# Patient Record
Sex: Male | Born: 1985 | Race: White | Hispanic: No | Smoking: Current every day smoker
Health system: Southern US, Community
[De-identification: ages and names within clinical notes are randomized; demographics above are authoritative.]

## PROBLEM LIST (undated history)

## (undated) DIAGNOSIS — F909 Attention-deficit hyperactivity disorder, unspecified type: Secondary | ICD-10-CM

## (undated) HISTORY — PX: NO PAST SURGERIES: SHX2092

---

## 2007-09-15 ENCOUNTER — Emergency Department: Payer: Self-pay | Admitting: Emergency Medicine

## 2007-12-27 ENCOUNTER — Emergency Department: Payer: Self-pay | Admitting: Emergency Medicine

## 2008-02-24 ENCOUNTER — Emergency Department: Payer: Self-pay | Admitting: Emergency Medicine

## 2008-07-24 ENCOUNTER — Emergency Department: Payer: Self-pay | Admitting: Emergency Medicine

## 2008-07-29 ENCOUNTER — Emergency Department: Payer: Self-pay | Admitting: Emergency Medicine

## 2013-11-29 ENCOUNTER — Emergency Department: Payer: Self-pay | Admitting: Emergency Medicine

## 2014-02-20 ENCOUNTER — Encounter (HOSPITAL_COMMUNITY): Payer: Self-pay | Admitting: *Deleted

## 2014-02-20 ENCOUNTER — Emergency Department (HOSPITAL_COMMUNITY)
Admission: EM | Admit: 2014-02-20 | Discharge: 2014-02-20 | Disposition: A | Discharge status: Home / Self Care | Payer: BLUE CROSS/BLUE SHIELD | Attending: Emergency Medicine | Admitting: Emergency Medicine

## 2014-02-20 DIAGNOSIS — Z720 Tobacco use: Secondary | ICD-10-CM | POA: Diagnosis not present

## 2014-02-20 DIAGNOSIS — Y998 Other external cause status: Secondary | ICD-10-CM | POA: Insufficient documentation

## 2014-02-20 DIAGNOSIS — Z880 Allergy status to penicillin: Secondary | ICD-10-CM | POA: Insufficient documentation

## 2014-02-20 DIAGNOSIS — Y9289 Other specified places as the place of occurrence of the external cause: Secondary | ICD-10-CM | POA: Diagnosis not present

## 2014-02-20 DIAGNOSIS — S39012A Strain of muscle, fascia and tendon of lower back, initial encounter: Secondary | ICD-10-CM | POA: Diagnosis not present

## 2014-02-20 DIAGNOSIS — Z7982 Long term (current) use of aspirin: Secondary | ICD-10-CM | POA: Insufficient documentation

## 2014-02-20 DIAGNOSIS — Z79899 Other long term (current) drug therapy: Secondary | ICD-10-CM | POA: Insufficient documentation

## 2014-02-20 DIAGNOSIS — X58XXXA Exposure to other specified factors, initial encounter: Secondary | ICD-10-CM | POA: Insufficient documentation

## 2014-02-20 DIAGNOSIS — Y9389 Activity, other specified: Secondary | ICD-10-CM | POA: Insufficient documentation

## 2014-02-20 DIAGNOSIS — T148XXA Other injury of unspecified body region, initial encounter: Secondary | ICD-10-CM

## 2014-02-20 DIAGNOSIS — R109 Unspecified abdominal pain: Secondary | ICD-10-CM | POA: Diagnosis present

## 2014-02-20 MED ORDER — DIAZEPAM 5 MG/ML IJ SOLN
5.0000 mg | Freq: Once | INTRAMUSCULAR | Status: AC
  Administered 2014-02-20: 5 mg via INTRAMUSCULAR
  Filled 2014-02-20: qty 2

## 2014-02-20 MED ORDER — CYCLOBENZAPRINE HCL 10 MG PO TABS: 10.0000 mg | ORAL_TABLET | Freq: Two times a day (BID) | ORAL | Status: DC | PRN

## 2014-02-20 NOTE — ED Provider Notes (Signed)
CSN: [id number]     Arrival date & time 02/20/14  1157 History   First MD Initiated Contact with Patient 02/20/14 1513     Chief Complaint  Patient presents with  . Abdominal Pain   (Consider location/radiation/quality/duration/timing/severity/associated sxs/prior Treatment) HPI Jacob Maynard is a 29 yo male presenting with persistent back and right sided rib pain x 2 weeks. He first noticed the pain 2 weeks ago as a gradual onset dull pain.  He states the pain seems to be progressively worsening and notes the pain became sharp approximately 1 week ago.  He was seen in the ED at [facility] and had labs and abd CT and diagnosed with constipation.  He was treated and this problem has resolved but his pain has continued.  He went back to the [facility] ED and told he had a pulled muscle but was discharged without further treatment due to an emergency in the ED.  He states while he is sitting still he is not bothered by the pain.  It is worsened by moving, bending and twisting.  The pain is also made worse with palpation.  He denies fevers, chills, nausea, vomiting, abd pain or urinary symptoms.    History reviewed. No pertinent past medical history. History reviewed. No pertinent past surgical history. History reviewed. No pertinent family history. History  Substance Use Topics  . Smoking status: Current Every Day Smoker    Types: Cigarettes  . Smokeless tobacco: Not on file  . Alcohol Use: No    Review of Systems  Constitutional: Negative for fever and chills.  HENT: Negative for sore throat.   Eyes: Negative for visual disturbance.  Respiratory: Negative for cough and shortness of breath.   Cardiovascular: Negative for chest pain and leg swelling.  Gastrointestinal: Negative for nausea, vomiting and diarrhea.  Genitourinary: Negative for dysuria.  Musculoskeletal: Positive for myalgias.  Skin: Negative for rash.  Neurological: Negative for weakness, numbness and headaches.    Allergies   Amoxicillin; Penicillins; and Tramadol  Home Medications   Prior to Admission medications   Medication Sig Start Date End Date Taking? Authorizing Provider  Aspirin-Acetaminophen-Caffeine (GOODY HEADACHE PO) Take 1 packet by mouth every 4 (four) hours as needed (headache).   Yes Historical Provider, MD  ibuprofen (ADVIL,MOTRIN) 200 MG tablet Take 400 mg by mouth every 4 (four) hours as needed for mild pain.   Yes Historical Provider, MD  NABUMETONE PO Take 750 mg by mouth 2 (two) times daily.   Yes Historical Provider, MD   BP 120/85 mmHg  Pulse 97  Temp(Src) 99.1 F (37.3 C) (Oral)  Resp 14  Ht  (1.727 m)  Wt 195 lb (88.451 kg)  BMI 29.66 kg/m2  SpO2 99% Physical Exam  Constitutional: He appears well-developed and well-nourished. No distress.  HENT:  Head: Normocephalic and atraumatic.  Mouth/Throat: Oropharynx is clear and moist. No oropharyngeal exudate.  Eyes: Conjunctivae are normal.  Neck: Neck supple. No thyromegaly present.  Cardiovascular: Normal rate, regular rhythm and intact distal pulses.   Pulmonary/Chest: Effort normal and breath sounds normal. No respiratory distress. He has no wheezes. He has no rales.  Abdominal: Soft. There is no tenderness.  Musculoskeletal: He exhibits tenderness.       Thoracic back: He exhibits tenderness.       Back:  Lymphadenopathy:    He has no cervical adenopathy.  Neurological: He is alert.  Skin: Skin is warm and dry. No rash noted. He is not diaphoretic.  Psychiatric: He  has a normal mood and affect.  Nursing note and vitals reviewed.   ED Course  Procedures (including critical care time) Labs Review Labs Reviewed  URINALYSIS, ROUTINE W REFLEX MICROSCOPIC   Imaging Review No results found.   EKG Interpretation None      MDM   Final diagnoses:  Musculoskeletal strain   29 yo with reproducible pain over thoracic paraspinous muscles and right posterior ribs.  Pain exacerbated by twisting and bending. He  was evaluated twice for pain with work-up including labs and abd/pelvis CT showing only constipation.  His pain on exam here seems to be musculoskeletal in nature. Treated with valium in the ED and discharged with prescription for muscle relaxants. Pt is well-appearing, in no acute distress and vital signs reviewed and non-concerning. He appears safe to be discharged.  Discharge include follow-up with their PCP.  Return precautions provided.  Pt aware of plan and in agreement.     Filed Vitals:   02/20/14 1210 02/20/14 1523 02/20/14 1530 02/20/14 1600  BP: 134/86 120/85 110/85 113/87  Pulse: 105 97 83 81  Temp: 97.3 F (36.3 C) 99.1 F (37.3 C)    TempSrc: Oral Oral    Resp: 18 14 27 17   Height: 5\' 8"  (1.727 m)     Weight: 195 lb (88.451 kg)     SpO2: 98% 99% 98% 98%   Meds given in ED:  Medications  diazepam (VALIUM) injection 5 mg (5 mg Intramuscular Given 02/20/14 1541)    Discharge Medication List as of 02/20/2014  4:09 PM    START taking these medications   Details  cyclobenzaprine (FLEXERIL) 10 MG tablet Take 1 tablet (10 mg total) by mouth 2 (two) times daily as needed for muscle spasms., Starting 02/20/2014, Until Discontinued, Print           Harle BattiestElizabeth Willeen Novak, NP 02/22/14 [id number]  Hilario Quarryanielle S Ray, MD 02/23/14 [phone]

## 2014-02-20 NOTE — Discharge Instructions (Signed)
These follow the directions provided. Use the resources guide with the referral provided to establish care with a primary care doctor. You may also make a follow-up appointment with Dr. Magnus IvanBlackman to evaluate this pain specifically. Continue to take her anti-inflammatory medicine as prescribed, but also begin taking the Flexeril twice a day to help with muscle spasms. Rest the area initially, and slowly begin to stretch and resume normal activities. You may use ice or heat for comfort, 20 minutes on 20 minutes off. Don't hesitate to return for any new, worsening, or concerning symptoms.  SEEK MEDICAL CARE IF:  You have increasing pain or swelling in the injured area.  You have numbness, tingling, or a significant loss of strength in the injured area.    Emergency Department Resource Guide 1) Find a Doctor and Pay Out of Pocket Although you won't have to find out who is covered by your insurance plan, it is a good idea to ask around and get recommendations. You will then need to call the office and see if the doctor you have chosen will accept you as a new patient and what types of options they offer for patients who are self-pay. Some doctors offer discounts or will set up payment plans for their patients who do not have insurance, but you will need to ask so you aren't surprised when you get to your appointment.  2) Contact Your Local Health Department Not all health departments have doctors that can see patients for sick visits, but many do, so it is worth a call to see if yours does. If you don't know where your local health department is, you can check in your phone book. The CDC also has a tool to help you locate your state's health department, and many state websites also have listings of all of their local health departments.  3) Find a Walk-in Clinic If your illness is not likely to be very severe or complicated, you may want to try a walk in clinic. These are popping up all over the country in  pharmacies, drugstores, and shopping centers. They're usually staffed by nurse practitioners or physician assistants that have been trained to treat common illnesses and complaints. They're usually fairly quick and inexpensive. However, if you have serious medical issues or chronic medical problems, these are probably not your best option.  No Primary Care Doctor: - Call Health Connect at  [phone] - they can help you locate a primary care doctor that  accepts your insurance, provides certain services, etc. - Physician Referral Service- [phone]  Chronic Pain Problems: Organization         Address  Phone   Notes  [facility]  [phone] Patients need to be referred by their primary care doctor.   Medication Assistance: Organization         Address  Phone   Notes  [facility] [address] [place], [place] [id number] [phone]) [phone] --Must be a resident of [facility] [place] -- Must have NO insurance coverage whatsoever (no Medicaid/ Medicare, etc.) -- The pt. MUST have a primary care doctor that directs their care regularly and follows them in the community   [facility]  [phone]   [employer]  [phone]) [phone]    Agencies that provide inexpensive medical care: Organization         Address  Phone   Notes  [facility]  [phone]) [phone]   [facility]    (  [phone]) [phone]   °[facility] [address] °[place], [place] [address] (336) [phone]   °[facility] [address], °[place] (336) [phone]   °[facility]    (336) 373-0678   °[facility]    (336) 272-1050   °[facility] ° [address], [place] Phone:  (336) [phone], Fax:  ([phone]) [phone] Hours of Operation:  9 am - 6 pm, M-F.  Also accepts Medicaid/Medicare and self-pay.  °[facility] ° [address],  [place] Phone: ([phone]) [phone], Fax: [phone]. Hours of Operation:  8:30 am - 5:30 pm, M-F.  Also accepts Medicaid and self-pay.  °[facility] [place] [address], [place] Phone: (336) [phone]   °[facility] [address], [place], [place] (336)[phone], Ext. 123 Mondays & Thursdays: 7-9 AM.  First 15 patients are seen on a first come, first serve basis. °  ° °Medicaid-accepting [place] Providers: ° °Organization         Address  Phone   Notes  °[facility] [address], [place] ([phone]) [phone] Also accepts self-pay patients.  °[facility] [address], [place] ° (336) [phone]   °[facility] [address], [place] (336) 288-8857   °[facility] [address] [place] [address], [place] (336) 299-7000   °Veita Bland [address], [place]  ° (336) [phone] Only accepts [place] Access Medicaid patients after they have their name applied to their card.  ° °Self-Pay (no insurance) in [place]: ° °Organization         Address  Phone   Notes  °Sickle Cell Patients, [facility] [address], [place] (336) 832-1970   °[facility] [address], [place] (336) 832-4400   °[facility] ° [address],  (336) [phone]   °[facility]/Dr. Osei-Bonsu ° [address] [place] [address], [place] or [address], [place] (336) [phone] Phone number for both [place] and [place] locations is the same.  °[facility] [address], [place] (336) [phone]   °[facility] [address] [place] [address],  or [address] (336) 852-7530 °(336) 878-2260   °[facility] [address],  (336) 350-1642, phone; ([phone]) [phone], fax Sees patients 1st and 3rd Saturday of every month.  Must not  qualify for public or private insurance (i.e. Medicaid, Medicare, [place] Health Choice, Veterans' Benefits) • Household income should be no more than 200% of the poverty level •The clinic cannot treat you if you are pregnant or think you are pregnant • Sexually transmitted diseases are not treated at the clinic.  ° ° °Dental Care: °Organization         Address  Phone  Notes  °[facility] [address],  (336) 641-6152 Accepts children up to age 21 who are enrolled in Medicaid or [place] Health Choice; pregnant women with a Medicaid card; and children who have applied for Medicaid or [place] Health Choice, but were declined, whose parents can pay a reduced fee at time of service.  °[facility] [place]  [address], [place] (336) 641-7733 Accepts children up   to age 21 who are enrolled in Medicaid or [place] Health Choice; pregnant women with a Medicaid card; and children who have applied for Medicaid or [place] Health Choice, but were declined, whose parents can pay a reduced fee at time of service.  °[facility] ° [address], [place] (336) 641-4533 Patients are seen by appointment only. Walk-ins are not accepted. [facility] will see patients 18 years of age and older. °Monday - Tuesday (8am-5pm) °Most Wednesdays (8:30-5pm) °$30 per visit, cash only  °[facility] ° [address], [place] (336) [phone] Patients are seen by appointment only. Walk-ins are not accepted. [facility] will see patients 18 years of age and older. °One Wednesday Evening (Monthly: Volunteer Based).  $30 per visit, cash only  °[facility]  ([phone]) [phone] for adults; Children under age 4, call Graduate Pediatric Dentistry at ([phone]. Children aged 4-14, please call [phone]) [phone] to request a pediatric application. ° Dental services are provided  in all areas of dental care including fillings, crowns and bridges, complete and partial dentures, implants, gum treatment, root canals, and extractions. Preventive care is also provided. Treatment is provided to both adults and children. °Patients are selected via a lottery and there is often a waiting list. °  °[facility] [address], °[place] ° (336) 763-8833 [email or web address] °  °[facility] [address], [place], [place] (336)723-1848, Ext. 123 Second and Fourth Thursday of each month, opens at 6:30 AM; Clinic ends at 9 AM.  Patients are seen on a first-come first-served basis, and a limited number are seen during each clinic.  ° °[facility] ° [address], [place], [place] (336) [phone]   Eligibility Requirements °You must have lived in [place], [place], or [place] counties for at least the last three months. °  You cannot be eligible for state or federal sponsored healthcare insurance, including Veterans Administration, Medicaid, or Medicare. °  You generally cannot be eligible for healthcare insurance through your employer.  °  How to apply: °Eligibility screenings are held every Tuesday and Wednesday afternoon from 1:00 pm until 4:00 pm. You do not need an appointment for the interview!  °[facility] [address], [place], [place] 336-631-2330   °[facility]  336-342-8273   °[facility]  336-703-3100   °[facility]  336-570-6415   ° °Behavioral Health Resources in the Community: °Intensive Outpatient Programs °Organization         Address  Phone  Notes  °[facility] [address], [place], [place] 336-878-6098   °[facility] [address], [place], [place] 336-832-9800   °[facility] [address], [place], [place] ° 336-882-2125   °[facility] [address],  °[place],   1-800-853-5163 or 336-641-4981   °Substance Abuse Resources °Organization         Address  Phone  Notes  °[facility]  336-882-2125   °[facility]  336-784-9470   °[facility]  336-285-9073   °[facility]  336-845-3988   °Residential & Outpatient Substance Abuse Program  1-800-659-3381   °Psychological Services °Organization         Address  Phone  Notes  °[facility]  336- 832-9600   °[facility]  336- 378-7881   °[facility]   [address], [place] 1-800-853-5163 or 336-641-4981   ° °Mobile Crisis Teams °Organization         Address  Phone  Notes  °[facility], Mobile Crisis Care Unit  1-877-626-1772   °Assertive °[facility] ° [address] [place], [place] 336-834-9664   °Sharon DeEsch [address] °[place] 336-554-5454   ° °Self-Help/Support Groups °Organization         Address  Phone             Notes  °[facility] - variety of support groups  336- 373-1402 Call for more information  °Narcotics Anonymous (NA), [facility] [address], °[place]  2 meetings at this location  ° °Residential Treatment Programs °Organization         Address  Phone  Notes  °[facility] [address],    °[place]  1-866-801-8205   °[facility] ° [address], [place], [place] 704-293-8524   °[facility] [address], [place] 336-845-3988 Admissions: 8am-3pm M-F  °[facility] [address]    °[place], [place] 336-841-1104   °[facility] [address], [place], [place] 336-379-7146   °[facility] [address]  °[place], [place] 336-285-9073   °[facility] Intensive Outpatient [address], [place], [place] 336-852-3033   °[facility] [address]  °[place], [place] 1-877-615-2722 or  336-784-9470   °[facility] ([facility] [address] [place], [place] 336-227-7417 Accepts Medicaid  °[facility] [address]  ° [place] 1-800-659-3381 Substance Abuse/Addiction Treatment  ° °[place] Behavioral Health Resources °Organization         Address  Phone  Notes  °[facility]  (888) 581-9988   °Julie Brannon, PhD [address] [place], [place]   (336) 349-5553 or (336) 951-0000   °[facility]   [address] °[place], [place] (336) 349-4454   °[facility] [address], [place], [place] (336) 342-8316 Insurance/Medicaid/sponsorship through [facility]  °[facility] [address]                                    [place], [place] (336) 342-8316 Therapy/tele-psych/case  °[facility] [address]  ° [place], [place] (336) 349-2233    °Dr. Arfeen  (336) 349-4544   °[facility] [place]  [employer] [place] [facility] 1) [address], [place] °2) [address], [place] °3)  [address], [place] (336) 349-3220 °(336) 342-7768 ° °(336) 342-8140   °[place] [facility] (336) 342-1394 or (336) 342-3537 (After Hours)    ° ° ° °

## 2014-02-20 NOTE — ED Notes (Signed)
Pt [facility] for right side pain x 12 days. 1st visit had negative ct scan for kidney stone. Denies urinary symptoms. They told pt was constipated and gave him laxative. Pt still had pain so he went back and they said muscle strain/rib pain. Pt still having pain and wants 3rd opinion and work note.

## 2014-03-01 ENCOUNTER — Other Ambulatory Visit (HOSPITAL_COMMUNITY): Payer: Self-pay | Admitting: Orthopaedic Surgery

## 2014-03-01 DIAGNOSIS — M25512 Pain in left shoulder: Secondary | ICD-10-CM

## 2014-03-17 ENCOUNTER — Ambulatory Visit (HOSPITAL_COMMUNITY): Admission: RE | Admit: 2014-03-17 | Payer: BLUE CROSS/BLUE SHIELD | Source: Ambulatory Visit

## 2016-05-08 ENCOUNTER — Encounter (HOSPITAL_COMMUNITY): Payer: Self-pay | Admitting: Emergency Medicine

## 2016-05-08 ENCOUNTER — Emergency Department (HOSPITAL_COMMUNITY)
Admission: EM | Admit: 2016-05-08 | Discharge: 2016-05-08 | Disposition: A | Discharge status: Home / Self Care | Payer: BLUE CROSS/BLUE SHIELD | Attending: Emergency Medicine | Admitting: Emergency Medicine

## 2016-05-08 ENCOUNTER — Emergency Department (HOSPITAL_COMMUNITY): Payer: BLUE CROSS/BLUE SHIELD

## 2016-05-08 DIAGNOSIS — Y92410 Unspecified street and highway as the place of occurrence of the external cause: Secondary | ICD-10-CM | POA: Diagnosis not present

## 2016-05-08 DIAGNOSIS — Y999 Unspecified external cause status: Secondary | ICD-10-CM | POA: Insufficient documentation

## 2016-05-08 DIAGNOSIS — Z7982 Long term (current) use of aspirin: Secondary | ICD-10-CM | POA: Insufficient documentation

## 2016-05-08 DIAGNOSIS — S060X0A Concussion without loss of consciousness, initial encounter: Secondary | ICD-10-CM | POA: Diagnosis not present

## 2016-05-08 DIAGNOSIS — Y939 Activity, unspecified: Secondary | ICD-10-CM | POA: Insufficient documentation

## 2016-05-08 DIAGNOSIS — F17210 Nicotine dependence, cigarettes, uncomplicated: Secondary | ICD-10-CM | POA: Diagnosis not present

## 2016-05-08 DIAGNOSIS — S0181XA Laceration without foreign body of other part of head, initial encounter: Secondary | ICD-10-CM | POA: Insufficient documentation

## 2016-05-08 DIAGNOSIS — S0990XA Unspecified injury of head, initial encounter: Secondary | ICD-10-CM | POA: Diagnosis present

## 2016-05-08 MED ORDER — LIDOCAINE HCL 2 % IJ SOLN
10.0000 mL | Freq: Once | INTRAMUSCULAR | Status: AC
  Administered 2016-05-08: 200 mg
  Filled 2016-05-08: qty 20

## 2016-05-08 MED ORDER — CYCLOBENZAPRINE HCL 10 MG PO TABS: 10.0000 mg | ORAL_TABLET | Freq: Two times a day (BID) | ORAL | 0 refills | Status: DC | PRN

## 2016-05-08 MED ORDER — IBUPROFEN 400 MG PO TABS
600.0000 mg | ORAL_TABLET | Freq: Once | ORAL | Status: AC
  Administered 2016-05-08: 600 mg via ORAL
  Filled 2016-05-08: qty 1

## 2016-05-08 MED ORDER — TETRACAINE HCL 0.5 % OP SOLN
1.0000 [drp] | Freq: Once | OPHTHALMIC | Status: AC
  Administered 2016-05-08: 1 [drp] via OPHTHALMIC
  Filled 2016-05-08: qty 2

## 2016-05-08 MED ORDER — TETANUS-DIPHTH-ACELL PERTUSSIS 5-2.5-18.5 LF-MCG/0.5 IM SUSP
0.5000 mL | Freq: Once | INTRAMUSCULAR | Status: AC
  Administered 2016-05-08: 0.5 mL via INTRAMUSCULAR
  Filled 2016-05-08: qty 0.5

## 2016-05-08 MED ORDER — FLUORESCEIN SODIUM 0.6 MG OP STRP
1.0000 | ORAL_STRIP | Freq: Once | OPHTHALMIC | Status: AC
  Administered 2016-05-08: 1 via OPHTHALMIC
  Filled 2016-05-08: qty 1

## 2016-05-08 MED ORDER — HYDROCODONE-ACETAMINOPHEN 5-325 MG PO TABS
1.0000 | ORAL_TABLET | Freq: Once | ORAL | Status: AC
  Administered 2016-05-08: 1 via ORAL
  Filled 2016-05-08: qty 1
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
## 2016-05-08 NOTE — ED Notes (Signed)
Dr. Anitra Lauth at bedside suturing pt.'s laceration .

## 2016-05-08 NOTE — ED Notes (Signed)
Dr. Anitra Lauth notified that pt. refused CT scan of head.

## 2016-05-08 NOTE — ED Notes (Signed)
Morgan lens applied at left eye / irrigation with NS solution in progress.

## 2016-05-08 NOTE — ED Notes (Signed)
Patient transported to CT SCAN . 

## 2016-05-28 ENCOUNTER — Other Ambulatory Visit: Payer: Self-pay | Admitting: Family Medicine

## 2016-06-13 ENCOUNTER — Other Ambulatory Visit: Payer: BLUE CROSS/BLUE SHIELD

## 2016-07-08 ENCOUNTER — Ambulatory Visit
Admission: RE | Admit: 2016-07-08 | Discharge: 2016-07-08 | Disposition: A | Discharge status: Home / Self Care | Payer: BLUE CROSS/BLUE SHIELD | Source: Ambulatory Visit | Attending: Family Medicine | Admitting: Family Medicine

## 2016-07-08 MED ORDER — IOPAMIDOL (ISOVUE-300) INJECTION 61%
75.0000 mL | Freq: Once | INTRAVENOUS | Status: AC | PRN
Start: 2016-07-08 — End: 2016-07-08
  Administered 2016-07-08: 75 mL via INTRAVENOUS

## 2017-10-17 ENCOUNTER — Other Ambulatory Visit: Payer: Self-pay

## 2017-10-17 ENCOUNTER — Encounter: Payer: Self-pay | Admitting: Emergency Medicine

## 2017-10-17 ENCOUNTER — Emergency Department: Payer: 59

## 2017-10-17 ENCOUNTER — Emergency Department
Admission: EM | Admit: 2017-10-17 | Discharge: 2017-10-17 | Disposition: A | Discharge status: Home / Self Care | Payer: 59 | Attending: Emergency Medicine | Admitting: Emergency Medicine

## 2017-10-17 DIAGNOSIS — F17210 Nicotine dependence, cigarettes, uncomplicated: Secondary | ICD-10-CM | POA: Insufficient documentation

## 2017-10-17 DIAGNOSIS — Y999 Unspecified external cause status: Secondary | ICD-10-CM | POA: Insufficient documentation

## 2017-10-17 DIAGNOSIS — W270XXA Contact with workbench tool, initial encounter: Secondary | ICD-10-CM | POA: Diagnosis not present

## 2017-10-17 DIAGNOSIS — Y929 Unspecified place or not applicable: Secondary | ICD-10-CM | POA: Diagnosis not present

## 2017-10-17 DIAGNOSIS — S6991XA Unspecified injury of right wrist, hand and finger(s), initial encounter: Secondary | ICD-10-CM | POA: Diagnosis present

## 2017-10-17 DIAGNOSIS — Y939 Activity, unspecified: Secondary | ICD-10-CM | POA: Insufficient documentation

## 2017-10-17 DIAGNOSIS — S62630B Displaced fracture of distal phalanx of right index finger, initial encounter for open fracture: Secondary | ICD-10-CM | POA: Insufficient documentation

## 2017-10-17 MED ORDER — LIDOCAINE HCL (PF) 1 % IJ SOLN
INTRAMUSCULAR | Status: AC
  Administered 2017-10-17: 5 mL via INTRADERMAL
  Filled 2017-10-17: qty 5

## 2017-10-17 MED ORDER — OXYCODONE-ACETAMINOPHEN 5-325 MG PO TABS: 1.0000 | ORAL_TABLET | Freq: Four times a day (QID) | ORAL | 0 refills | Status: AC | PRN

## 2017-10-17 MED ORDER — LIDOCAINE HCL (PF) 1 % IJ SOLN
5.0000 mL | Freq: Once | INTRAMUSCULAR | Status: AC
  Administered 2017-10-17: 5 mL via INTRADERMAL
  Filled 2017-10-17: qty 5

## 2017-10-17 MED ORDER — CLINDAMYCIN HCL 300 MG PO CAPS: 300.0000 mg | ORAL_CAPSULE | Freq: Four times a day (QID) | ORAL | 0 refills | Status: AC

## 2017-10-17 MED ORDER — BACITRACIN-NEOMYCIN-POLYMYXIN 400-5-5000 EX OINT
TOPICAL_OINTMENT | Freq: Once | CUTANEOUS | Status: AC
  Administered 2017-10-17: 21:00:00 via TOPICAL
  Filled 2017-10-17: qty 1

## 2017-10-17 MED ORDER — OXYCODONE-ACETAMINOPHEN 5-325 MG PO TABS
1.0000 | ORAL_TABLET | Freq: Once | ORAL | Status: AC
  Administered 2017-10-17: 1 via ORAL
  Filled 2017-10-17: qty 1

## 2017-10-17 MED ORDER — CLINDAMYCIN PHOSPHATE 600 MG/50ML IV SOLN
600.0000 mg | Freq: Once | INTRAVENOUS | Status: AC
  Administered 2017-10-17: 600 mg via INTRAVENOUS
  Filled 2017-10-17: qty 50
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
## 2017-10-17 NOTE — ED Triage Notes (Signed)
First nurse notified of pt
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
## 2021-03-12 NOTE — ED Provider Notes (Signed)
 ------------------------------------------------------------------------------- Attestation signed by Junita Zachary Sages, DO at 03/12/21 1919 I was the supervising physician in the delivery of the service.  I have reviewed the H&P and agree with the assessment and plan in the note.  -------------------------------------------------------------------------------  [facility] Emergency Department Provider Note    ED Clinical Impression   Final diagnoses:  Viral illness (Primary)    ED Course   Jacob Maynard is a 36 y.o. male with a past medical history of COVID-19 two months ago presenting with flulike symptoms for the last 2 days.   On exam, patient is well-appearing and in NAD.  Vitals are WNL.  Cardiopulmonary exam is unremarkable.  Abdomen soft nontender.  4 Plex done in triage negative.  Suspect viral etiology.  Unclear if this is COVID-19 but he has taken off work until next Tuesday which is probably the best decision.  Supportive care instructions given.  Work note given. Return precautions given. Patient in agreement with plan.    History   Chief Complaint  Letter for School/Work   HPI  Jacob Maynard is a 36 y.o. male with a past medical history of COVID-19 two months ago presenting with flulike symptoms for the last 2 days.  He notes the plan that he works in as an outbreak of COVID.  He started to get sick on Sunday with diarrhea, vomiting, nausea, headache, fever although none today, cough and runny nose.  He has been taking antipyretics for his symptoms.  He had a positive home test for COVID on Monday.  He denies any abdominal pain, chest pain or shortness of breath.  He has been able to tolerate p.o. today without difficulty.  He says this is very different than his symptoms with COVID previously.  He had much more shortness of breath.  He has also had a false positive previously.  His work told him he needed to get a PCR test so he presents to the  emergency department.   Past Medical History:  Diagnosis Date  . Dental decay     No past surgical history on file.  No current facility-administered medications for this encounter.  Current Outpatient Medications:  .  albuterol HFA 90 mcg/actuation inhaler, Inhale 2 puffs every six (6) hours as needed for wheezing., Disp: 8 g, Rfl: 0  Allergies Ibuprofen , Amoxicillin, Penicillins, and Tramadol  History reviewed. No pertinent family history.  Social History Social History   Tobacco Use  . Smoking status: Every Day    Packs/day: 1.00    Types: Cigarettes  . Smokeless tobacco: Never  Vaping Use  . Vaping Use: Never used  Substance Use Topics  . Alcohol use: No  . Drug use: Yes    Types: Marijuana    Comment: Last Use MJ- 01/06/21    Review of Systems Review of Systems  Constitutional: Positive for fever.  HENT: Positive for rhinorrhea. Negative for sore throat.   Respiratory: Positive for cough. Negative for shortness of breath.   Cardiovascular: Negative for chest pain.  Gastrointestinal: Positive for diarrhea, nausea and vomiting. Negative for abdominal pain.  Genitourinary: Negative for dysuria.  Musculoskeletal: Negative for myalgias.  Skin: Negative for rash.  Neurological: Positive for headaches.  All other systems reviewed and are negative.   Physical Exam   Vital Signs BP 122/83   Pulse 87   Temp 36.7 C (98 F) (Temporal)   Resp 18   Ht 172.7 cm (5' 8)   Wt (!) 101.6 kg (224 lb)  SpO2 98%   BMI 34.06 kg/m   Physical Exam Physical Exam Vitals and nursing note reviewed.  Constitutional:      General: He is not in acute distress.    Appearance: He is well-developed.  HENT:     Head: Normocephalic and atraumatic.  Eyes:     General: No scleral icterus.    Conjunctiva/sclera: Conjunctivae normal.  Cardiovascular:     Rate and Rhythm: Normal rate and regular rhythm.     Heart sounds: Normal heart sounds. No murmur heard.   No friction  rub. No gallop.  Pulmonary:     Effort: Pulmonary effort is normal. No respiratory distress.     Breath sounds: Normal breath sounds. No wheezing or rales.  Abdominal:     General: Bowel sounds are normal. There is no distension.     Palpations: Abdomen is soft. There is no mass.     Tenderness: There is no abdominal tenderness. There is no guarding or rebound.  Musculoskeletal:        General: Normal range of motion.     Cervical back: Normal range of motion.  Skin:    General: Skin is warm and dry.     Findings: No rash.  Neurological:     Mental Status: He is alert and oriented to person, place, and time.     Coordination: Coordination normal.  Psychiatric:        Behavior: Behavior normal.        Thought Content: Thought content normal.        Judgment: Judgment normal.     Pertinent Labs and Radiology   Results for orders placed or performed during the hospital encounter of 03/12/21  RAPID INFLUENZA/RSV/COVID PCR   Specimen: Nasopharyngeal Swab  Result Value Ref Range   SARS-CoV-2 PCR Negative Negative   Influenza A Negative Negative   Influenza B Negative Negative   RSV Negative Negative    No results found.  Medical Decision Making Viral illness: acute illness or injury Amount and/or Complexity of Data Reviewed Labs:  Decision-making details documented in ED Course.   Risk OTC drugs.      Please note, this note was created using voice recognition technology.  I have made efforts to catch any phonetic/typographical errors but these may not always be found. Such errors, especially pronoun confusion, do not reflect on the standard of medical care.  In addition, patient is being seen during the COVID-19 pandemic and emergency departments are still being affected by historic bed and staffing shortages throughout the country.   Rollene FALCON [place], [place] 03/12/21 [phone]

## 2023-10-08 ENCOUNTER — Encounter: Payer: Self-pay | Admitting: Emergency Medicine

## 2023-10-08 ENCOUNTER — Emergency Department
Admission: EM | Admit: 2023-10-08 | Discharge: 2023-10-11 | Disposition: A | Discharge status: Home / Self Care | Payer: Self-pay | Attending: Emergency Medicine | Admitting: Emergency Medicine

## 2023-10-08 ENCOUNTER — Other Ambulatory Visit: Payer: Self-pay

## 2023-10-08 DIAGNOSIS — F32A Depression, unspecified: Secondary | ICD-10-CM

## 2023-10-08 DIAGNOSIS — T45514A Poisoning by anticoagulants, undetermined, initial encounter: Secondary | ICD-10-CM | POA: Insufficient documentation

## 2023-10-08 DIAGNOSIS — F329 Major depressive disorder, single episode, unspecified: Secondary | ICD-10-CM | POA: Insufficient documentation

## 2023-10-08 DIAGNOSIS — T45511A Poisoning by anticoagulants, accidental (unintentional), initial encounter: Secondary | ICD-10-CM | POA: Diagnosis present

## 2023-10-08 DIAGNOSIS — F4323 Adjustment disorder with mixed anxiety and depressed mood: Secondary | ICD-10-CM

## 2023-10-08 DIAGNOSIS — F1990 Other psychoactive substance use, unspecified, uncomplicated: Secondary | ICD-10-CM

## 2023-10-08 DIAGNOSIS — F988 Other specified behavioral and emotional disorders with onset usually occurring in childhood and adolescence: Secondary | ICD-10-CM

## 2023-10-08 DIAGNOSIS — F909 Attention-deficit hyperactivity disorder, unspecified type: Secondary | ICD-10-CM | POA: Insufficient documentation

## 2023-10-08 HISTORY — DX: Attention-deficit hyperactivity disorder, unspecified type: F90.9

## 2023-10-08 LAB — COMPREHENSIVE METABOLIC PANEL WITH GFR
ALT: 16 U/L (ref 0–44)
AST: 19 U/L (ref 15–41)
Albumin: 4 g/dL (ref 3.5–5.0)
Alkaline Phosphatase: 91 U/L (ref 38–126)
Anion gap: 10 (ref 5–15)
BUN: 18 mg/dL (ref 6–20)
CO2: 27 mmol/L (ref 22–32)
Calcium: 8.8 mg/dL — ABNORMAL LOW (ref 8.9–10.3)
Chloride: 105 mmol/L (ref 98–111)
Creatinine, Ser: 0.8 mg/dL (ref 0.61–1.24)
GFR, Estimated: >60 mL/min (ref >60)
Glucose, Bld: 96 mg/dL (ref 70–99)
Potassium: 3.5 mmol/L (ref 3.5–5.1)
Sodium: 142 mmol/L (ref 135–145)
Total Bilirubin: 0.8 mg/dL (ref 0.0–1.2)
Total Protein: 6.7 g/dL (ref 6.5–8.1)

## 2023-10-08 LAB — CBC WITH DIFFERENTIAL/PLATELET
Abs Immature Granulocytes: 0.04 K/uL (ref 0.00–0.07)
Basophils Absolute: 0.1 K/uL (ref 0.0–0.1)
Basophils Relative: 1 %
Eosinophils Absolute: 0.4 K/uL (ref 0.0–0.5)
Eosinophils Relative: 4 %
HCT: 41.6 % (ref 39.0–52.0)
Hemoglobin: 14.4 g/dL (ref 13.0–17.0)
Immature Granulocytes: 0 %
Lymphocytes Relative: 29 %
Lymphs Abs: 2.6 K/uL (ref 0.7–4.0)
MCH: 30.6 pg (ref 26.0–34.0)
MCHC: 34.6 g/dL (ref 30.0–36.0)
MCV: 88.3 fL (ref 80.0–100.0)
Monocytes Absolute: 0.8 K/uL (ref 0.1–1.0)
Monocytes Relative: 9 %
Neutro Abs: 5.1 K/uL (ref 1.7–7.7)
Neutrophils Relative %: 57 %
Platelets: 285 K/uL (ref 150–400)
RBC: 4.71 MIL/uL (ref 4.22–5.81)
RDW: 12.5 % (ref 11.5–15.5)
WBC: 9 K/uL (ref 4.0–10.5)
nRBC: 0 % (ref 0.0–0.2)

## 2023-10-08 LAB — URINE DRUG SCREEN, QUALITATIVE (ARMC ONLY)
Amphetamines, Ur Screen: NOT DETECTED
Barbiturates, Ur Screen: NOT DETECTED
Benzodiazepine, Ur Scrn: NOT DETECTED
Cannabinoid 50 Ng, Ur ~~LOC~~: NOT DETECTED
Cocaine Metabolite,Ur ~~LOC~~: NOT DETECTED
MDMA (Ecstasy)Ur Screen: NOT DETECTED
Methadone Scn, Ur: NOT DETECTED
Opiate, Ur Screen: NOT DETECTED
Phencyclidine (PCP) Ur S: NOT DETECTED
Tricyclic, Ur Screen: NOT DETECTED

## 2023-10-08 LAB — PROTIME-INR
INR: 1 (ref 0.8–1.2)
Prothrombin Time: 14 s (ref 11.4–15.2)

## 2023-10-08 LAB — ETHANOL: Alcohol, Ethyl (B): <15 mg/dL (ref <15)

## 2023-10-08 LAB — SALICYLATE LEVEL: Salicylate Lvl: <7 mg/dL — ABNORMAL LOW (ref 7.0–30.0)

## 2023-10-08 LAB — ACETAMINOPHEN LEVEL: Acetaminophen (Tylenol), Serum: 23 ug/mL (ref 10–30)

## 2023-10-08 NOTE — ED Provider Notes (Signed)
 [facility] Provider Note    None    (approximate)   History   Ingestion   HPI  Jacob Maynard is a 38 y.o. male   Past medical history of no significant past medical history presents to the emergency department with accidental overdose -he called EMS this morning because he states that he accidentally overdosed on 10 pills of warfarin 7.5 mg.  He states that he thought these pills were Benadryl .  He says that he was having an allergic reaction as he was working in the yard throughout the day yesterday and over the course of 12 hours had taken 2 pills at a time every few hours for a total of 10 pills of what he thought were Benadryl .    He otherwise feels well.  He reports no trauma or acute pain.  He reports no coingestions and he denies any self-harm attempt.  He occasionally smokes weed but denies any other drug use, and no significant alcohol use.  He does note that within the last few days he had a break-up with his fiance and she has moved out.  EMS reports to me privately that they had a call out to the patient's residence a few days ago for reports of suicidality from the patient.  Independent Historian contributed to assessment above: EMS gives report as above       Physical Exam   Triage Vital Signs: ED Triage Vitals  Encounter Vitals Group     BP 10/08/23 0639 127/84     Girls Systolic BP Percentile --      Girls Diastolic BP Percentile --      Boys Systolic BP Percentile --      Boys Diastolic BP Percentile --      Pulse Rate 10/08/23 0639 81     Resp 10/08/23 0639 16     Temp 10/08/23 0639 98.1 F (36.7 C)     Temp Source 10/08/23 0639 Oral     SpO2 10/08/23 0639 100 %     Weight --      Height --      Head Circumference --      Peak Flow --      Pain Score 10/08/23 0640 0     Pain Loc --      Pain Education --      Exclude from Growth Chart --     Most recent vital signs: Vitals:   10/08/23 0639 10/08/23 0650   BP: 127/84   Pulse: 81   Resp: 16   Temp: 98.1 F (36.7 C)   SpO2: 100% 100%    General: Awake, no distress.  CV:  Good peripheral perfusion.  Resp:  Normal effort.  Abd:  No distention.  Other:  Normal vital signs.  Interactive patient though avoiding eye contact.  Moving all extremities.  No obvious signs of trauma to the head or arms that are exposed on my exam.  Soft benign abdominal exam and clear lungs.   ED Results / Procedures / Treatments   Labs (all labs ordered are listed, but only abnormal results are displayed) Labs Reviewed  ACETAMINOPHEN  LEVEL  COMPREHENSIVE METABOLIC PANEL WITH GFR  ETHANOL  SALICYLATE LEVEL  CBC WITH DIFFERENTIAL/PLATELET  URINE DRUG SCREEN, QUALITATIVE ([facility] ONLY)  PROTIME-INR      EKG  ED ECG REPORT I, Ginnie Shams, the attending physician, personally viewed and interpreted this ECG.   Date: 10/08/2023  EKG Time: 0641  Rate: 80  Rhythm: sinus  Axis: nl  Intervals:nl  ST&T Change: no stemi   PROCEDURES:  Critical Care performed: No  Procedures   MEDICATIONS ORDERED IN ED: Medications - No data to display   IMPRESSION / MDM / ASSESSMENT AND PLAN / ED COURSE  I reviewed the triage vital signs and the nursing notes.                                Patient's presentation is most consistent with acute presentation with potential threat to life or bodily function.  Differential diagnosis includes, but is not limited to, accidental versus intentional overdose   The patient is on the cardiac monitor to evaluate for evidence of arrhythmia and/or significant heart rate changes.  MDM:    Patient with a reported overdose of 10 x 7.5 milligram warfarin over the last 12 hours, which she thought were Benadryl .  No reported coingestions.  He states that this was an accidental ingestion however EMS reporting a call placed regarding suicidality concerns in this patient within the last couple of days in the setting of breaking up  with his fiance.  I am concerned that he is not fully disclosing to me the intentions of his overdose.  I have placed him under IVC at this time for psychiatric evaluation after he is medically clear.  I have ordered for basic labs and toxicologic labs, as well as the [facility] poison control recommendations for an INR to be tested now as well as 24 hours from now.       FINAL CLINICAL IMPRESSION(S) / ED DIAGNOSES   Final diagnoses:  Warfarin overdosage, undetermined intent, initial encounter     Rx / DC Orders   ED Discharge Orders     None        Note:  This document was prepared using Dragon voice recognition software and may include unintentional dictation errors.    Cyrena Mylar, MD 10/08/23 [phone]

## 2023-10-08 NOTE — ED Notes (Signed)
 Pt given dinner tray at this time.

## 2023-10-08 NOTE — ED Notes (Incomplete)
 Suzen came to visit with pt, she insists she is the pt's wife and that they have been married more than 20 yrs although they do not have the same last name. She appears much older than pt.

## 2023-10-08 NOTE — ED Notes (Signed)
 Poison Control called to say Pt accidentally took 75 mg of Warfarin ( his ex-girlfriend's) thinking it was Benadryl .   Patient states he was taking for allergies.  Poison control stated he needed for us  to check an INR now and in 24 hours a repeat.  He does not need to stay here to be monitored.

## 2023-10-08 NOTE — ED Notes (Signed)
 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  Pt cooperative.

## 2023-10-08 NOTE — ED Notes (Signed)
 Pt refused evening snack.

## 2023-10-08 NOTE — ED Notes (Signed)
 Per Garrel from poison control, he says it is odd that pt's INR is 1.0 if he took all the warfarin he claims to have ingested even if it was stretched out over 15 hrs. He feels INR should be higher than what it is. He is recommending pt to have repeat labs in the a.m. to recheck levels. Per EDP Dr Claudene, pt will stay overnight and be re-evaluated after the results for the 24hr INR result.

## 2023-10-08 NOTE — ED Triage Notes (Signed)
 Pt arrives ems, ambulatory to room, reporting pt took 10 (7.5 mg) warfarin tablets over the past 15 hours.. pt thought he was taking benadryl  due to medication being in a benadryl  bottle... pt states he feels drunk. Denies pain or sob.   EMS reports pt called ecom six days prior called out for suicidal ideations due to recent break up. Pt denies SI today.  Pt reports taking 2 tablets every 1-2 hours for the past 15 hours. Denies falls or injuries.  Pt educated on appropriate dosage and frequency of benadryl  administration.  Charge contacted poison control prior to pt's arrival to ER

## 2023-10-08 NOTE — ED Notes (Signed)
 Pt agreed for Jacob Maynard, the ex-GF to take his silver jewerly and a key that were in his belongings. He states she will need his key to access the animals in his house to feed them.

## 2023-10-08 NOTE — ED Provider Notes (Signed)
  Physical Exam  BP 127/84 (BP Location: Right Arm)   Pulse 81   Temp 98.1 F (36.7 C) (Oral)   Resp 16   SpO2 100%   Physical Exam  Procedures  Procedures  ED Course / MDM   Clinical Course as of 10/08/23 1534  Thu Oct 08, 2023  1506 S/o: 70M w/ overdose on benadryl  or warfarin - took 10 pills of unknown substance - has bed held for inpt psych - if 24h INR normal, stable for inpt  TO DO: - AM INR on 10/09/23 [MM]    Clinical Course User Index [MM] Clarine Ozell LABOR, MD   Medical Decision Making Amount and/or Complexity of Data Reviewed Labs: ordered.          Clarine Ozell LABOR, MD 10/08/23 2358

## 2023-10-08 NOTE — ED Notes (Signed)
 Snack and cup of water given to pt at this time.

## 2023-10-08 NOTE — ED Notes (Signed)
 Pt given urine cup and drink per request

## 2023-10-08 NOTE — ED Notes (Addendum)
 Suzen came to visit with pt, she insists she is the pt's wife and that they have been married more than 20 yrs although they do not have the same last name. She appears much older than pt. Pt states she is the ex-GF. During the visit she had to be educated on the rules of the [facility] visitation when observed hugging and kissing the pt when she initially came into the unit. She attempted to argue with staff saying she hasn't seen her husband for a week. Staff reiterated the rules and she finally said she will abide by the rules. As she was leaving, she hugged and kissed the pt again and staff informed her that her visitation will be restricted if she cannot abide by the [facility] rules. She was advised that if she does attempt to visit again, she should not be surprised if the visit is denied by staff if they feel she will have a problem complying with visitation rules. She apologized but then stated she was wanted to speak with psychiatry team. She was informed that visit has ended and she needs to leave the unit and psychiatry will be informed that she wants to talk with them and they will reach out to her.

## 2023-10-08 NOTE — BH Assessment (Signed)
 Comprehensive Clinical Assessment (CCA) Screening, Triage and Referral Note  10/08/2023 Jacob Maynard [id number]  Chief Complaint:  Chief Complaint  Patient presents with   Ingestion   Visit Diagnosis: Major Depressive Disorder  Jacob Maynard is a 38 year old male who presents to the ER due to taking a significant about of his fianc's medications. He reports, he thought it was Benadryl  and he was taking it because he was having reactions while doing yard work. However, ever taking several does of the medications, realized the medications wasn't working and he wasn't getting sleepy. That is when he discovered he wasn't taking the Benadryl . He denies SI/HI and AV/H. Spoke with the former girlfriend, who's medications he was taking. She stated they broke up approximately a week ago. The medication he stated he had used, she shared she has it with her. She voiced concerns of him harming his self because of the increase drug use. Patient lost his job approximately three months ago because he failed a random drug screen.   Patient Reported Information How did you hear about us ? Self  What Is the Reason for Your Visit/Call Today? Brought to the ER due to accidental overdose.  How Long Has This Been Causing You Problems? 1 wk - 1 month  What Do You Feel Would Help You the Most Today? Treatment for Depression or other mood problem   Have You Recently Had Any Thoughts About Hurting Yourself? No  Are You Planning to Commit Suicide/Harm Yourself At This time? No   Have you Recently Had Thoughts About Hurting Someone [place]? No  Are You Planning to Harm Someone at This Time? No  Explanation: No data recorded  Have You Used Any Alcohol or Drugs in the Past 24 Hours? No  How Long Ago Did You Use Drugs or Alcohol? No data recorded What Did You Use and How Much? No data recorded  Do You Currently Have a Therapist/Psychiatrist? No  Name of Therapist/Psychiatrist: No data recorded  Have You  Been Recently Discharged From Any Office Practice or Programs? No  Explanation of Discharge From Practice/Program: No data recorded   CCA Screening Triage Referral Assessment Type of Contact: Face-to-Face  Telemedicine Service Delivery:   Is this Initial or Reassessment?   Date Telepsych consult ordered in [facility]:    Time Telepsych consult ordered in [facility]:    Location of Assessment: [facility] ED  Provider Location: [facility] ED    Collateral Involvement: No data recorded  Does Patient Have a Court Appointed Legal Guardian? No data recorded Name and Contact of Legal Guardian: No data recorded If Minor and Not Living with Parent(s), Who has Custody? No data recorded Is CPS involved or ever been involved? Never  Is APS involved or ever been involved? Never   Patient Determined To Be At Risk for Harm To Self or Others Based on Review of Patient Reported Information or Presenting Complaint? No  Method: No data recorded Availability of Means: No data recorded Intent: No data recorded Notification Required: No data recorded Additional Information for Danger to Others Potential: No data recorded Additional Comments for Danger to Others Potential: No data recorded Are There Guns or Other Weapons in Your Home? No data recorded Types of Guns/Weapons: No data recorded Are These Weapons Safely Secured?                            No data recorded Who Could Verify You Are Able To Have These Secured:  No data recorded Do You Have any Outstanding Charges, Pending Court Dates, Parole/Probation? No data recorded Contacted To Inform of Risk of Harm To Self or Others: No data recorded  Does Patient Present under Involuntary Commitment? Yes    [place] of Residence: [place]   Patient Currently Receiving the Following Services: Not Receiving Services   Determination of Need: Emergent (2 hours)   Options For Referral: ED Visit   Disposition Recommendation per psychiatric provider: Pending Psych  Consult  Jacob DOROTHA Barge MS, LCAS, [facility], [facility] Therapeutic Triage Specialist 10/08/2023 9:26 AM
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
## 2023-10-08 NOTE — ED Notes (Signed)
 IVC PT Rec inpt Pysch hospilization after Medical Hospilization

## 2023-10-08 NOTE — ED Notes (Signed)
Pt given lunch tray and water  

## 2023-10-09 LAB — PROTIME-INR
INR: 2.8 — ABNORMAL HIGH (ref 0.8–1.2)
INR: 3.8 — ABNORMAL HIGH (ref 0.8–1.2)
Prothrombin Time: 31.1 s — ABNORMAL HIGH (ref 11.4–15.2)
Prothrombin Time: 39.4 s — ABNORMAL HIGH (ref 11.4–15.2)

## 2023-10-09 NOTE — ED Provider Notes (Signed)
-----------------------------------------   8:53 PM on 10/09/2023 ----------------------------------------- Patient's INR is slightly rising.  From 2.8-3.8 over approximately 12 hours.  Poison control recommends rechecking an INR in 12 additional hours.  I have ordered an INR for 7 AM.   Dorothyann Drivers, MD 10/09/23 2053

## 2023-10-09 NOTE — ED Notes (Signed)
 Pt claimed spouse, Suzen called and asked to speak with pt. She was informed that it is outside of the allotted phone times. She was provided with phone hours and visitation hours per policy.

## 2023-10-09 NOTE — ED Notes (Addendum)
 Poison Control, Marcus, called for update on pt.

## 2023-10-09 NOTE — ED Notes (Signed)
IVC/Pending psych consult. 

## 2023-10-09 NOTE — ED Notes (Signed)
Dinner tray provided at bedside.

## 2023-10-09 NOTE — ED Notes (Signed)
 Pt given snack and a cup of water at this time.

## 2023-10-09 NOTE — ED Notes (Signed)
 Provided pt with breakfast tray and juice.

## 2023-10-09 NOTE — ED Notes (Signed)
Lunch tray left at bedside.

## 2023-10-09 NOTE — ED Notes (Signed)
 RN taking over care of pt at this time after receiving handoff. Pt reporting to ED d/t attempted OD by taking Warfarin, claiming he believed it was Benadryl . Pt currently being observed for signs of bleeding. Pt has not shown any signs thus far. Pt ABCs intact. RR even and unlabored. Pt in NAD. Bed in lowest locked position. Pt being monitored by security camera feed and frequently checked on. Pt denies needs at this time.   Past Medical History:  Diagnosis Date   ADHD (attention deficit hyperactivity disorder)

## 2023-10-09 NOTE — ED Notes (Signed)
Ivc/ pending psych consult

## 2023-10-09 NOTE — ED Notes (Addendum)
 Called poison control with INR of 2.8. Per Kristi, the pt should remain in ER and a repeat INR to be done in 12 hrs. If the levels keep going higher to reach out to them, but if the levels starts trending down, pt can be cleared to go to psych inpatient per POC. She did not give any specific level, just stated the levels should be lower before pt can leave the ER. She did ask if their is any bleeding or bruising noted on pt and I told her pt has no bleeding or bruising noted at this time. She states to call them back with further questions if needed. She also stated that they will reach out to check on the updated INR level at 8pm tonight.

## 2023-10-09 NOTE — ED Provider Notes (Signed)
 Emergency Medicine Observation Re-evaluation Note  Jacob Maynard is a 38 y.o. male, seen on rounds today.  Pt initially presented to the ED for complaints of Ingestion  Currently, the patient is no acute distress. Concern for INR level being elevated this morning.   Physical Exam  Blood pressure 109/67, pulse 71, temperature 98.1 F (36.7 C), temperature source Oral, resp. rate 16, SpO2 98%.  Physical Exam General: No apparent distress Pulm: Normal WOB Psych: resting     ED Course / MDM   Clinical Course as of 10/09/23 0803  Thu Oct 08, 2023  1506 S/o: 62M w/ overdose on benadryl  or warfarin - took 10 pills of unknown substance - has bed held for inpt psych - if 24h INR normal, stable for inpt  TO DO: - AM INR on 10/09/23 [MM]    Clinical Course User Index [MM] Clarine Ozell LABOR, MD    I have reviewed the labs performed to date as well as medications administered while in observation.  Recent changes in the last 24 hours include elevated INR   Plan   Current plan is to continue to wait for psych placement Patient is under full IVC at this time. Will d/w Poison control given elevated INR- pt NOT cleared for psych admission yet.  They recommend a repeat INR at 8 PM.  If downtrending patient can be medically cleared for psych but if uptrending will need to be continued to be monitored. I have ordered this test and patient will be handed off to oncoming team pending these results   Ernest Ronal BRAVO, MD 10/09/23 [phone]

## 2023-10-10 LAB — PROTIME-INR
INR: 4.3 (ref 0.8–1.2)
INR: 4.3 (ref 0.8–1.2)
Prothrombin Time: 43.3 s — ABNORMAL HIGH (ref 11.4–15.2)
Prothrombin Time: 43.1 s — ABNORMAL HIGH (ref 11.4–15.2)

## 2023-10-10 NOTE — ED Notes (Signed)
 Assumed care of patient.

## 2023-10-10 NOTE — ED Notes (Signed)
 Pt showered and changed clothes and bed sheets. Pt disposed of all materials appropriately

## 2023-10-10 NOTE — ED Notes (Signed)
IVC/Pending psych consult. 

## 2023-10-10 NOTE — ED Notes (Signed)
 Leslie at poison control updated on AM lab values, no new recommendations, continue to monitor serial PT/INR.  Pt to remain in ED until INR begins to trend downward.  Continue to observe for any signs of bleeding. Dr. Jacolyn updated on same.  Will monitor pt closely.

## 2023-10-10 NOTE — ED Notes (Signed)
 Patient given snack at the bedside. No acute needs at this time.

## 2023-10-10 NOTE — ED Notes (Signed)
 Notified poison control of most recent INR of 4.3.  Olam with poison control stated to get another PT/INR in the morning if continues to stay same or trend downward they may clear him and he will just need outpatient follow up to show continuing downward trend.  MD to be notified.

## 2023-10-10 NOTE — ED Provider Notes (Signed)
 Emergency Medicine Observation Re-evaluation Note  Jacob Maynard is a 38 y.o. male, seen on rounds today.  Pt initially presented to the ED for complaints of Ingestion Currently, the patient is resting comfortably in bed.  Physical Exam  BP 101/60 (BP Location: Right Arm)   Pulse (!) 59   Temp 98.9 F (37.2 C) (Oral)   Resp 16   SpO2 99%  Physical Exam General: No distress  ED Course / MDM   I have reviewed the labs performed to date as well as medications administered while in observation.  INR this morning has continued to rise slightly, to 4.3.  The patient remains asymptomatic with no bleeding.  There is no indication for reversal at this time.  Poison control was contacted and recommends continued monitoring of the INR level.  I have ordered repeat INR checks for tonight and tomorrow morning.  Otherwise, no acute events.  Plan  Current plan is for psychiatry inpatient admission once the INR level is downtrending and the patient is medically cleared.    Jacolyn Pae, MD 10/10/23 [phone]

## 2023-10-10 NOTE — ED Notes (Signed)
 Pt provided with breakfast tray.

## 2023-10-10 NOTE — ED Notes (Signed)
 Dinner tray provided for pt at bedside at this time.

## 2023-10-10 NOTE — ED Notes (Signed)
Pt provided with lunch tray at bedside

## 2023-10-11 ENCOUNTER — Other Ambulatory Visit: Payer: Self-pay

## 2023-10-11 ENCOUNTER — Inpatient Hospital Stay
Admission: AD | Admit: 2023-10-11 | Discharge: 2023-10-17 | DRG: 885 | Disposition: A | Discharge status: Home / Self Care | Payer: 59 | Source: Intra-hospital | Attending: Psychiatry | Admitting: Psychiatry

## 2023-10-11 ENCOUNTER — Encounter: Payer: Self-pay | Admitting: Psychiatry

## 2023-10-11 DIAGNOSIS — F17210 Nicotine dependence, cigarettes, uncomplicated: Secondary | ICD-10-CM | POA: Diagnosis present

## 2023-10-11 DIAGNOSIS — F332 Major depressive disorder, recurrent severe without psychotic features: Principal | ICD-10-CM | POA: Diagnosis present

## 2023-10-11 DIAGNOSIS — F32A Depression, unspecified: Principal | ICD-10-CM | POA: Diagnosis present

## 2023-10-11 DIAGNOSIS — Z560 Unemployment, unspecified: Secondary | ICD-10-CM

## 2023-10-11 DIAGNOSIS — K0889 Other specified disorders of teeth and supporting structures: Secondary | ICD-10-CM | POA: Diagnosis present

## 2023-10-11 DIAGNOSIS — T45511A Poisoning by anticoagulants, accidental (unintentional), initial encounter: Secondary | ICD-10-CM | POA: Diagnosis present

## 2023-10-11 DIAGNOSIS — F419 Anxiety disorder, unspecified: Secondary | ICD-10-CM | POA: Diagnosis present

## 2023-10-11 DIAGNOSIS — Z885 Allergy status to narcotic agent status: Secondary | ICD-10-CM | POA: Diagnosis not present

## 2023-10-11 DIAGNOSIS — Z818 Family history of other mental and behavioral disorders: Secondary | ICD-10-CM

## 2023-10-11 DIAGNOSIS — Z880 Allergy status to penicillin: Secondary | ICD-10-CM | POA: Diagnosis not present

## 2023-10-11 DIAGNOSIS — F329 Major depressive disorder, single episode, unspecified: Secondary | ICD-10-CM | POA: Diagnosis present

## 2023-10-11 LAB — PROTIME-INR
INR: 4.2 (ref 0.8–1.2)
Prothrombin Time: 42.1 s — ABNORMAL HIGH (ref 11.4–15.2)

## 2023-10-11 MED ORDER — HALOPERIDOL 5 MG PO TABS: 5.0000 mg | ORAL_TABLET | Freq: Three times a day (TID) | ORAL | Status: DC | PRN

## 2023-10-11 MED ORDER — DIPHENHYDRAMINE HCL 25 MG PO CAPS: 50.0000 mg | ORAL_CAPSULE | Freq: Three times a day (TID) | ORAL | Status: DC | PRN

## 2023-10-11 MED ORDER — NICOTINE 14 MG/24HR TD PT24
14.0000 mg | MEDICATED_PATCH | Freq: Every day | TRANSDERMAL | Status: DC
  Administered 2023-10-12 – 2023-10-17 (×6): 14 mg via TRANSDERMAL
  Filled 2023-10-11 (×6): qty 1

## 2023-10-11 MED ORDER — ALUM & MAG HYDROXIDE-SIMETH 200-200-20 MG/5ML PO SUSP: 30.0000 mL | ORAL | Status: DC | PRN

## 2023-10-11 MED ORDER — HALOPERIDOL LACTATE 5 MG/ML IJ SOLN: 5.0000 mg | Freq: Three times a day (TID) | INTRAMUSCULAR | Status: DC | PRN

## 2023-10-11 MED ORDER — MAGNESIUM HYDROXIDE 400 MG/5ML PO SUSP: 30.0000 mL | Freq: Every day | ORAL | Status: DC | PRN

## 2023-10-11 MED ORDER — LORAZEPAM 2 MG/ML IJ SOLN: 2.0000 mg | Freq: Three times a day (TID) | INTRAMUSCULAR | Status: DC | PRN

## 2023-10-11 MED ORDER — ACETAMINOPHEN 325 MG PO TABS
650.0000 mg | ORAL_TABLET | Freq: Four times a day (QID) | ORAL | Status: DC | PRN
  Administered 2023-10-13 – 2023-10-16 (×8): 650 mg via ORAL
  Filled 2023-10-11 (×7): qty 2

## 2023-10-11 MED ORDER — DIPHENHYDRAMINE HCL 50 MG/ML IJ SOLN: 50.0000 mg | Freq: Three times a day (TID) | INTRAMUSCULAR | Status: DC | PRN

## 2023-10-11 MED ORDER — HYDROXYZINE HCL 25 MG PO TABS
25.0000 mg | ORAL_TABLET | Freq: Three times a day (TID) | ORAL | Status: DC | PRN
  Administered 2023-10-11 – 2023-10-17 (×12): 25 mg via ORAL
  Filled 2023-10-11 (×12): qty 1

## 2023-10-11 MED ORDER — TRAZODONE HCL 50 MG PO TABS
50.0000 mg | ORAL_TABLET | Freq: Every evening | ORAL | Status: DC | PRN
  Administered 2023-10-11 – 2023-10-16 (×6): 50 mg via ORAL
  Filled 2023-10-11 (×6): qty 1
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
## 2023-10-11 NOTE — Group Note (Signed)
 Date:  10/11/2023 Time:  5:23 PM  Group Topic/Focus:  Goals Group:   The focus of this group is to help patients establish daily goals to achieve during treatment and discuss how the patient can incorporate goal setting into their daily lives to aide in recovery.    Participation Level:  Did Not Attend   Jacob Maynard 10/11/2023, 5:23 PM

## 2023-10-11 NOTE — ED Provider Notes (Signed)
 Emergency Medicine Observation Re-evaluation Note  Jacob Maynard is a 38 y.o. male, seen on rounds today.  Pt initially presented to the ED for complaints of Ingestion  Currently, the patient is resting in bed. No reported issues from nursing team.   Physical Exam  BP 110/68 (BP Location: Left Arm)   Pulse (!) 55   Temp 97.9 F (36.6 C) (Oral)   Resp 17   SpO2 98%  Physical Exam General: Resting in bed  ED Course / MDM   INR stable at 4.3 on multiple checks yesterday. Repeat ordered for this morning resulted at 4.2   Plan  Current plan is for dispo per psychiatry. RN updated poison control regarding now downtrending INR. Patient is now medically cleared. PC recommends daily INR which could otherwise be performed as an outpatient, but will continue plan for psychiatry disposition with current plan for inpatient placement.    [name], MD 10/11/23 [phone]

## 2023-10-11 NOTE — Group Note (Signed)
 Date:  10/11/2023 Time:  10:24 PM  Additional Comments:  N/A   Jacob Maynard 10/11/2023, 10:24 PM

## 2023-10-11 NOTE — ED Notes (Signed)
 This RN notified poison control of most recent INR of 4.2. Poison control states to continue to check INR daily, but ok for pt to be followed out-pt. Nilsa Dade, MD notified of poison control recommendations.

## 2023-10-11 NOTE — ED Notes (Signed)
 Pt provided lunch tray at beside

## 2023-10-11 NOTE — ED Notes (Signed)
 Report called to [place], [facility]

## 2023-10-11 NOTE — ED Notes (Signed)
 Pt provided with breakfast tray.

## 2023-10-12 NOTE — Plan of Care (Signed)
   Problem: Education: Goal: Knowledge of [facility] Education information/materials will improve Outcome: Progressing   Problem: Education: Goal: Emotional status will improve Outcome: Progressing   Problem: Education: Goal: Mental status will improve Outcome: Progressing   Problem: Education: Goal: Verbalization of understanding the information provided will improve Outcome: Progressing

## 2023-10-12 NOTE — [facility] Suicide Risk Assessment (Signed)
 [facility] Admission Suicide Risk Assessment   Nursing information obtained from:  Patient Demographic factors:  Male, Caucasian Current Mental Status:  NA Loss Factors:  NA Historical Factors:  NA Risk Reduction Factors:  Employed  Total Time spent with patient: 30 minutes Principal Problem: Depression, unspecified Diagnosis:  Principal Problem:   Depression, unspecified  Subjective Data: This is a 38 year old male with limited psychiatric treatment history.  Presenting issues included reported suicide attempt via ingestion of warfarin.  Patient is admitted to the adult inpatient unit with every 15-minute safety monitoring.  Multidisciplinary team approach is offered.  Medication management, group/milieu therapy is offered.  Continued Clinical Symptoms:  Alcohol Use Disorder Identification Test Final Score (AUDIT): 0 The Alcohol Use Disorders Identification Test, Guidelines for Use in Primary Care, Second Edition.  World Science writer [facility]). Score between 0-7:  no or low risk or alcohol related problems. Score between 8-15:  moderate risk of alcohol related problems. Score between 16-19:  high risk of alcohol related problems. Score 20 or above:  warrants further diagnostic evaluation for alcohol dependence and treatment.   CLINICAL FACTORS:   Depression:   Hopelessness Impulsivity   Musculoskeletal: Strength & Muscle Tone: within normal limits Gait & Station: normal Patient leans: N/A  Psychiatric Specialty Exam:  Presentation  General Appearance:  Casual  Eye Contact: Good  Speech: Clear and Coherent  Speech Volume: Normal  Handedness:No data recorded  Mood and Affect  Mood: Euthymic  Affect: Congruent   Thought Process  Thought Processes: Coherent  Descriptions of Associations:Intact  Orientation:Full (Time, Place and Person)  Thought Content:Logical  History of Schizophrenia/Schizoaffective disorder:No data recorded Duration of Psychotic  Symptoms:No data recorded Hallucinations:Hallucinations: None  Ideas of Reference:None  Suicidal Thoughts:Suicidal Thoughts: No  Homicidal Thoughts:Homicidal Thoughts: No   Sensorium  Memory: Immediate Good; Recent Good; Remote Good  Judgment: Poor  Insight: Poor   Executive Functions  Concentration: Fair  Attention Span: Fair  Recall: Fair  Fund of Knowledge: Fair  Language: Fair   Psychomotor Activity  Psychomotor Activity: Psychomotor Activity: Normal   Assets  Assets: Communication Skills   Sleep  Sleep: Sleep: Good    Physical Exam: Physical Exam HENT:     Head: Atraumatic.     Nose: Nose normal.  Pulmonary:     Effort: Pulmonary effort is normal.  Neurological:     Mental Status: He is alert.  Psychiatric:        Attention and Perception: Attention normal. He does not perceive auditory or visual hallucinations.        Mood and Affect: Mood is not anxious or depressed.        Behavior: Behavior is cooperative.        Thought Content: Thought content is not paranoid or delusional. Thought content includes suicidal ideation. Thought content does not include homicidal ideation. Thought content includes suicidal plan. Thought content does not include homicidal plan.        Judgment: Judgment is impulsive.    Review of Systems  Psychiatric/Behavioral:  Negative for depression, hallucinations and suicidal ideas. The patient is not nervous/anxious.   All other systems reviewed and are negative.  Blood pressure 104/62, pulse 79, temperature 98.2 F (36.8 C), resp. rate 16, height 5' 8 (1.727 m), weight 79.8 kg, SpO2 100%. Body mass index is 26.76 kg/m.   COGNITIVE FEATURES THAT CONTRIBUTE TO RISK:  None    SUICIDE RISK:   Severe:  Frequent, intense, and enduring suicidal ideation, specific plan, no subjective intent, but  some objective markers of intent (i.e., choice of lethal method), the method is accessible, some limited preparatory  behavior, evidence of impaired self-control, severe dysphoria/symptomatology, multiple risk factors present, and few if any protective factors, particularly a lack of social support.  PLAN OF CARE: Patient is admitted to the adult inpatient unit with every 15-minute safety monitoring.  Multidisciplinary team approach is offered.  Medication management, group/milieu therapy is offered.  I certify that inpatient services furnished can reasonably be expected to improve the patient's condition.   Camelia LITTIE Lukes, PA-C 10/12/2023, 6:13 PM

## 2023-10-12 NOTE — Group Note (Signed)
 Recreation Therapy Group Note   Group Topic:Coping Skills  Group Date: 10/12/2023 Start Time: 1230 End Time: 1320 Facilitators: Celestia Jeoffrey BRAVO, LRT, CTRS Location: Craft Room  Group Description: Mind Map.  Patient was provided a blank template of a diagram with 32 blank boxes in a tiered system, branching from the center (similar to a bubble chart). LRT directed patients to label the middle of the diagram Coping Skills. LRT and patients then came up with 8 different coping skills as examples. Pt were directed to record their coping skills in the 2nd tier boxes closest to the center.  Patients would then share their coping skills with the group as LRT wrote them out. LRT gave a handout of 99 different coping skills at the end of group.   Goal Area(s) Addressed: Patients will be able to define "coping skills". Patient will identify new coping skills.  Patient will increase communication.   Affect/Mood: Appropriate and Blunted   Participation Level: Active and Engaged   Participation Quality: Independent   Behavior: Calm and Cooperative   Speech/Thought Process: Coherent   Insight: Fair   Judgement: Fair    Modes of Intervention: Clarification, Education, Worksheet, and Writing   Patient Response to Interventions:  Attentive and Receptive   Education Outcome:  Acknowledges education   Clinical Observations/Individualized Feedback: Jacob Maynard was active in their participation of session activities and group discussion. Pt identified animals and boxing as coping skills.   Plan: Continue to engage patient in RT group sessions 2-3x/week.   Jeoffrey BRAVO Celestia, LRT, CTRS 10/12/2023 2:01 PM

## 2023-10-12 NOTE — BH IP Treatment Plan (Signed)
 Interdisciplinary Treatment and Diagnostic Plan Update  10/12/2023 Time of Session: 10:41 AM Jacob Maynard Jacob Maynard MRN: [id number]  Principal Diagnosis: Depression, unspecified  Secondary Diagnoses: Principal Problem:   Depression, unspecified   Current Medications:  Current Facility-Administered Medications  Medication Dose Route Frequency Provider Last Rate Last Admin   acetaminophen  (TYLENOL ) tablet 650 mg  650 mg Oral Q6H PRN Hampton, Tracie B, NP       alum & mag hydroxide-simeth (MAALOX/MYLANTA) 200-200-20 MG/5ML suspension 30 mL  30 mL Oral Q4H PRN Hampton, Tracie B, NP       haloperidol  (HALDOL ) tablet 5 mg  5 mg Oral TID PRN Hampton, Tracie B, NP       And   diphenhydrAMINE  (BENADRYL ) capsule 50 mg  50 mg Oral TID PRN Hampton, Tracie B, NP       haloperidol  lactate (HALDOL ) injection 5 mg  5 mg Intramuscular TID PRN Hampton, Tracie B, NP       And   diphenhydrAMINE  (BENADRYL ) injection 50 mg  50 mg Intramuscular TID PRN Hampton, Tracie B, NP       And   LORazepam  (ATIVAN ) injection 2 mg  2 mg Intramuscular TID PRN Hampton, Tracie B, NP       hydrOXYzine  (ATARAX ) tablet 25 mg  25 mg Oral TID PRN Hampton, Tracie B, NP   25 mg at 10/12/23 1055   magnesium  hydroxide (MILK OF MAGNESIA) suspension 30 mL  30 mL Oral Daily PRN Hampton, Tracie B, NP       nicotine  (NICODERM CQ  - dosed in mg/24 hours) patch 14 mg  14 mg Transdermal Daily Jadapalle, Sree, MD   14 mg at 10/12/23 1054   traZODone  (DESYREL ) tablet 50 mg  50 mg Oral QHS PRN Hampton, Tracie B, NP   50 mg at 10/11/23 2145   PTA Medications: No medications prior to admission.    Patient Stressors:    Patient Strengths:    Treatment Modalities: Medication Management, Group therapy, Case management,  1 to 1 session with clinician, Psychoeducation, Recreational therapy.   Physician Treatment Plan for Primary Diagnosis: Depression, unspecified Long Term Goal(s):     Short Term Goals:    Medication Management: Evaluate  patient's response, side effects, and tolerance of medication regimen.  Therapeutic Interventions: 1 to 1 sessions, Unit Group sessions and Medication administration.  Evaluation of Outcomes: Not Met  Physician Treatment Plan for Secondary Diagnosis: Principal Problem:   Depression, unspecified  Long Term Goal(s):     Short Term Goals:       Medication Management: Evaluate patient's response, side effects, and tolerance of medication regimen.  Therapeutic Interventions: 1 to 1 sessions, Unit Group sessions and Medication administration.  Evaluation of Outcomes: Not Met   RN Treatment Plan for Primary Diagnosis: Depression, unspecified Long Term Goal(s): Knowledge of disease and therapeutic regimen to maintain health will improve  Short Term Goals: Ability to verbalize frustration and anger appropriately will improve, Ability to demonstrate self-control, Ability to participate in decision making will improve, Ability to verbalize feelings will improve, Ability to disclose and discuss suicidal ideas, Ability to identify and develop effective coping behaviors will improve, and Compliance with prescribed medications will improve  Medication Management: RN will administer medications as ordered by provider, will assess and evaluate patient's response and provide education to patient for prescribed medication. RN will report any adverse and/or side effects to prescribing provider.  Therapeutic Interventions: 1 on 1 counseling sessions, Psychoeducation, Medication administration, Evaluate responses to treatment, Monitor vital  signs and CBGs as ordered, Perform/monitor CIWA, COWS, AIMS and Fall Risk screenings as ordered, Perform wound care treatments as ordered.  Evaluation of Outcomes: Not Met   LCSW Treatment Plan for Primary Diagnosis: Depression, unspecified Long Term Goal(s): Safe transition to appropriate next level of care at discharge, Engage patient in therapeutic group addressing  interpersonal concerns.  Short Term Goals: Engage patient in aftercare planning with referrals and resources, Increase social support, Increase ability to appropriately verbalize feelings, Increase emotional regulation, Facilitate acceptance of mental health diagnosis and concerns, Facilitate patient progression through stages of change regarding substance use diagnoses and concerns, Identify triggers associated with mental health/substance abuse issues, and Increase skills for wellness and recovery  Therapeutic Interventions: Assess for all discharge needs, 1 to 1 time with Social worker, Explore available resources and support systems, Assess for adequacy in community support network, Educate family and significant other(s) on suicide prevention, Complete Psychosocial Assessment, Interpersonal group therapy.  Evaluation of Outcomes: Not Met   Progress in Treatment: Attending groups: No. Participating in groups: No. Taking medication as prescribed: Yes. Toleration medication: Yes. Family/Significant other contact made: No, will contact:  once permission has been given Patient understands diagnosis: Yes. Discussing patient identified problems/goals with staff: Yes. Medical problems stabilized or resolved: Yes. Denies suicidal/homicidal ideation: Yes. Issues/concerns per patient self-inventory: No. Other: none  New problem(s) identified: No, Describe:  none  New Short Term/Long Term Goal(s): detox, elimination of symptoms of psychosis, medication management for mood stabilization; elimination of SI thoughts; development of comprehensive mental wellness/sobriety plan.   Patient Goals:  to get out of here  Discharge Plan or Barriers: CSW to assist in the development of appropriate discharge plans to assist patient.   Reason for Continuation of Hospitalization: Anxiety Depression Medication stabilization Suicidal ideation  Estimated Length of Stay:  1-7 days  Last 3 [place]  Suicide Severity Risk Score: Flowsheet Row Admission (Current) from 10/11/2023 in [facility] ED from 10/08/2023 in [facility] Emergency Department at [facility]  C-SSRS RISK CATEGORY No Risk No Risk    Last PHQ 2/9 Scores:     No data to display          Scribe for Treatment Team: Sherryle JINNY Margo, KEN 10/12/2023 12:26 PM

## 2023-10-12 NOTE — Progress Notes (Signed)
   10/12/23 2000  Psych Admission Type (Psych Patients Only)  Admission Status Involuntary  Psychosocial Assessment  Patient Complaints None  Eye Contact Fair  Facial Expression Animated  Affect Anxious  Speech Logical/coherent  Interaction Cautious  Motor Activity Slow  Appearance/Hygiene Disheveled  Behavior Characteristics Cooperative;Appropriate to situation  Mood Anxious  Thought Process  Coherency WDL  Content WDL  Delusions None reported or observed  Perception WDL  Hallucination None reported or observed  Judgment WDL  Confusion WDL  Danger to Self  Current suicidal ideation? Denies  Danger to Others  Danger to Others None reported or observed

## 2023-10-12 NOTE — Plan of Care (Signed)
  Problem: Education: Goal: Mental status will improve Outcome: Progressing   

## 2023-10-12 NOTE — Progress Notes (Signed)
Patient denies SI,HI,AVH. 

## 2023-10-12 NOTE — Plan of Care (Signed)
  Problem: Education: Goal: Emotional status will improve Outcome: Progressing Goal: Mental status will improve Outcome: Progressing   Problem: Coping: Goal: Ability to verbalize frustrations and anger appropriately will improve Outcome: Progressing   

## 2023-10-12 NOTE — H&P (Signed)
 Psychiatric Admission Assessment Adult  Patient Identification: Jacob Maynard MRN:  [id number] Date of Evaluation:  10/12/2023 Chief Complaint:  Depression, unspecified [F32.A]   History of Present Illness: This is a 38 y/o male patient with limited psychiatric treatment history, significant psychosocial stressors including recent relationship loss, unemployment, and lack of supports, presenting under IVC after reported accidental ingestion of warfarin initially believed to be Benadryl . Despite his denial of suicidality, collateral reveals recent suicidal behaviors, threats, physical aggression, and a prior attempt this year. His presentation, combined with psychosocial decline, poor supports, and conflicting reports regarding intentionality, indicates risk for self-injury, and lack of safe discharge plan. Patient is currently under involuntary commitment and requires inpatient psychiatric hospitalization for medication management and stabilization due to risk of self injury.    On interview today, patient is seated on the bed in his room.  He is calm and cooperative.  He is alert and oriented.  He continues to minimize suspected suicide attempt stating he took his fiance's warfarin by accident thinking it was Benadryl .  He states he took 10 or less pills in an attempt to treat bug bites on his leg after working in his yard.  He states he called 911 after realizing he took the wrong pills.  He denies this was a suicide attempt.  He denies SI/HI/plan and denies hallucinations.  Denies prior suicide attempts or previous psychiatric hospitalizations.  He denies previous episodes of self-harm.  He currently lives alone stating he recently asked his fiance to move out.  Patient denies depressed mood, feelings of hopelessness or worthlessness, or anxiety.  He reports good energy level and motivation.  He denies history of abuse, nightmares, or flashbacks.  He reports sleeping 4 to 5 hours per night with  difficulty falling asleep and staying asleep.  He reports appetite is normal with recent intentional weight loss.  He denies symptoms of mania.  Total Time spent with patient: 1 hour Sleep  Sleep:Sleep: Good  Past Psychiatric History: Remote history of ADD per patient Psychiatric History:  Information collected from the patient.  Prev Dx/Sx: Denies Current Psych Provider: None Home Meds (current): None Previous Med Trials: None Therapy: None  Prior Psych Hospitalization: Denies Prior Self Harm: Denies Prior Violence: Denies  Family Psych History: Schizophrenia in uncle, bipolar disorder in uncle Family Hx suicide: Paternal uncle, possibly others  Social History:  Educational Hx: GED Occupational Hx: Currently unemployed and looking for work, previously worked in Data processing manager Hx: Denies pending legal charges Living Situation: Lives alone currently, previously lived with fiance until recent Access to weapons/lethal means: Denies   Substance History Alcohol: Denies Tobacco: 2 to 10 cigarettes/day Illicit drugs: Marijuana Prescription drug abuse: Denies Rehab hx: Denies Is the patient at risk to self? Yes.    Has the patient been a risk to self in the past 6 months? unknown Has the patient been a risk to self within the distant past? Unknown  Is the patient a risk to others? No.  Has the patient been a risk to others in the past 6 months? No.  Has the patient been a risk to others within the distant past? No.   [place] Scale:  Flowsheet Row Admission (Current) from 10/11/2023 in [facility] INPATIENT BEHAV[facility] MEDICINE ED from 10/08/2023 in [facility] Emergency Department at [facility]  C-SSRS RISK CATEGORY No Risk No Risk     Past Medical History:  Past Medical History:  Diagnosis Date   ADHD (attention deficit hyperactivity disorder)  Past Surgical History:  Procedure Laterality Date   NO PAST SURGERIES     Family History: History reviewed. No  pertinent family history.  Social History:  Social History   Substance and Sexual Activity  Alcohol Use No     Social History   Substance and Sexual Activity  Drug Use Yes   Types: Marijuana   Comment: daily, recenlty hx of methamphetamine and cocaine      Allergies:   Allergies  Allergen Reactions   Amoxicillin     Doesn't remember   Penicillins     Doesn't remember   Tramadol Nausea And Vomiting   Lab Results:  Results for orders placed or performed during the hospital encounter of 10/08/23 (from the past 48 hours)  Protime-INR     Status: Abnormal   Collection Time: 10/10/23  6:46 PM  Result Value Ref Range   Prothrombin Time 43.1 (H) 11.4 - 15.2 seconds   INR 4.3 (HH) 0.8 - 1.2    Comment: CRITICAL RESULT CALLED TO, READ BACK BY AND VERIFIED WITH: DAWN [facility] RN 10/10/23 2111 KG (NOTE) INR goal varies based on device and disease states. Performed at [facility], [address] [place], [place] [address]   Protime-INR     Status: Abnormal   Collection Time: 10/11/23  6:36 AM  Result Value Ref Range   Prothrombin Time 42.1 (H) 11.4 - 15.2 seconds   INR 4.2 (HH) 0.8 - 1.2    Comment: CRITICAL RESULT CALLED TO, READ BACK BY AND VERIFIED WITH: ASHLEY DAVIS 10/11/23 0958 SLM (NOTE) INR goal varies based on device and disease states. Performed at [facility], [address] [place], [place] [address]     Blood Alcohol level:  Lab Results  Component Value Date   [facility] <15 10/08/2023    Metabolic Disorder Labs:  No results found for: HGBA1C, MPG No results found for: PROLACTIN No results found for: CHOL, TRIG, HDL, CHOLHDL, VLDL, LDLCALC  Current Medications: Current Facility-Administered Medications  Medication Dose Route Frequency Provider Last Rate Last Admin   acetaminophen  (TYLENOL ) tablet 650 mg  650 mg Oral Q6H PRN Hampton, Tracie B, NP       alum & mag hydroxide-simeth (MAALOX/MYLANTA) 200-200-20 MG/5ML  suspension 30 mL  30 mL Oral Q4H PRN Hampton, Tracie B, NP       haloperidol  (HALDOL ) tablet 5 mg  5 mg Oral TID PRN Hampton, Tracie B, NP       And   diphenhydrAMINE  (BENADRYL ) capsule 50 mg  50 mg Oral TID PRN Hampton, Tracie B, NP       haloperidol  lactate (HALDOL ) injection 5 mg  5 mg Intramuscular TID PRN Hampton, Tracie B, NP       And   diphenhydrAMINE  (BENADRYL ) injection 50 mg  50 mg Intramuscular TID PRN Hampton, Tracie B, NP       And   LORazepam  (ATIVAN ) injection 2 mg  2 mg Intramuscular TID PRN Hampton, Tracie B, NP       hydrOXYzine  (ATARAX ) tablet 25 mg  25 mg Oral TID PRN Hampton, Tracie B, NP   25 mg at 10/12/23 1055   magnesium  hydroxide (MILK OF MAGNESIA) suspension 30 mL  30 mL Oral Daily PRN Hampton, Tracie B, NP       nicotine  (NICODERM CQ  - dosed in mg/24 hours) patch 14 mg  14 mg Transdermal Daily Jadapalle, Sree, MD   14 mg at 10/12/23 1054   traZODone  (DESYREL ) tablet 50 mg  50 mg Oral QHS PRN Hampton, Tracie B, NP   50 mg at 10/11/23 2145   PTA Medications: No medications prior to admission.    Psychiatric Specialty Exam:  Presentation  General Appearance:  Casual  Eye Contact: Good  Speech: Clear and Coherent  Speech Volume: Normal    Mood and Affect  Mood: Euthymic  Affect: Congruent   Thought Process  Thought Processes: Coherent  Descriptions of Associations:Intact  Orientation:Full (Time, Place and Person)  Thought Content:Logical  Hallucinations:Hallucinations: None  Ideas of Reference:None  Suicidal Thoughts:Suicidal Thoughts: No  Homicidal Thoughts:Homicidal Thoughts: No   Sensorium  Memory: Immediate Good; Recent Good; Remote Good  Judgment: Poor  Insight: Poor   Executive Functions  Concentration: Fair  Attention Span: Fair  Recall: [employer] of Knowledge: Fair  Language: Fair   Psychomotor Activity  Psychomotor Activity: Psychomotor Activity: Normal   Assets  Assets: Communication  Skills    Musculoskeletal: Strength & Muscle Tone: within normal limits Gait & Station: normal  Physical Exam: Physical Exam HENT:     Head: Atraumatic.     Nose: Nose normal.  Pulmonary:     Effort: Pulmonary effort is normal.  Neurological:     Mental Status: He is alert.  Psychiatric:        Attention and Perception: Attention normal. He does not perceive auditory or visual hallucinations.        Mood and Affect: Mood normal.        Speech: Speech normal.        Behavior: Behavior is cooperative.        Thought Content: Thought content is not paranoid or delusional. Thought content does not include homicidal or suicidal ideation. Thought content does not include homicidal or suicidal plan.        Judgment: Judgment is impulsive.    Review of Systems  Psychiatric/Behavioral:  Negative for depression, hallucinations and suicidal ideas. The patient is not nervous/anxious.   All other systems reviewed and are negative.  Blood pressure 104/62, pulse 79, temperature 98.2 F (36.8 C), resp. rate 16, height 5' 8 (1.727 m), weight 79.8 kg, SpO2 100%. Body mass index is 26.76 kg/m.  Principal Diagnosis: Depression, unspecified Diagnosis:  Principal Problem:   Depression, unspecified   Clinical Decision Making: Patient appears to be minimizing suspected suicide attempt.  Will continue to evaluate suspected underlying depression.  Treatment Plan Summary:  Safety and Monitoring:             -- Voluntary admission to inpatient psychiatric unit for safety, stabilization and treatment             -- Daily contact with patient to assess and evaluate symptoms and progress in treatment             -- Patient's case to be discussed in multi-disciplinary team meeting             -- Observation Level: q15 minute checks             -- Vital signs:  q12 hours             -- Precautions: suicide, elopement, and assault   2. Psychiatric Diagnoses and Treatment:              Depression,  unspecified     -- The risks/benefits/side-effects/alternatives to this medication were discussed in detail with the patient and time was given for questions. The patient consents to medication trial.                --  Metabolic profile and EKG monitoring obtained while on an atypical antipsychotic (BMI: Lipid Panel: HbgA1c: QTc:)              -- Encouraged patient to participate in unit milieu and in scheduled group therapies                            3. Medical Issues Being Addressed:      4. Discharge Planning:              -- Social work and case management to assist with discharge planning and identification of hospital follow-up needs prior to discharge             -- Estimated LOS: 5-7 days             -- Discharge Concerns: Need to establish a safety plan; Medication compliance and effectiveness             -- Discharge Goals: Return home with outpatient referrals follow ups  Physician Treatment Plan for Primary Diagnosis: Depression, unspecified Long Term Goal(s): Improvement in symptoms so as ready for discharge  Short Term Goals: Ability to identify changes in lifestyle to reduce recurrence of condition will improve, Ability to verbalize feelings will improve, Ability to disclose and discuss suicidal ideas, and Ability to identify and develop effective coping behaviors will improve  Physician Treatment Plan for Secondary Diagnosis: Principal Problem:   Depression, unspecified  Long Term Goal(s): Improvement in symptoms so as ready for discharge  Short Term Goals: Ability to identify changes in lifestyle to reduce recurrence of condition will improve, Ability to verbalize feelings will improve, Ability to disclose and discuss suicidal ideas, and Ability to identify and develop effective coping behaviors will improve  I certify that inpatient services furnished can reasonably be expected to improve the patient's condition.    Camelia LITTIE Lukes, PA-C 9/15/20256:20 PM

## 2023-10-13 NOTE — Group Note (Signed)
 Date:  10/13/2023 Time:  5:47 PM  Group Topic/Focus:  Wellness Toolbox:   The focus of this group is to discuss various aspects of wellness, balancing those aspects and exploring ways to increase the ability to experience wellness.  Patients will create a wellness toolbox for use upon discharge.    Participation Level:  Did Not Attend   Jacob Maynard [facility] 10/13/2023, 5:47 PM

## 2023-10-13 NOTE — Plan of Care (Signed)
   Problem: Education: Goal: Emotional status will improve Outcome: Progressing

## 2023-10-13 NOTE — Group Note (Signed)
 Date:  10/13/2023 Time:  1:57 PM  Group Topic/Focus:  Making Healthy Choices:   The focus of this group is to help patients identify negative/unhealthy choices they were using prior to admission and identify positive/healthier coping strategies to replace them upon discharge.    Participation Level:  Did Not Attend   Jacob Maynard 10/13/2023, 1:57 PM

## 2023-10-13 NOTE — Progress Notes (Signed)
   10/13/23 0900  Psych Admission Type (Psych Patients Only)  Admission Status Involuntary  Psychosocial Assessment  Patient Complaints None  Eye Contact Fair  Facial Expression Animated  Affect Other (Comment) (WNL)  Speech Logical/coherent  Interaction Cautious  Motor Activity Other (Comment) (WNL)  Appearance/Hygiene Disheveled  Behavior Characteristics Cooperative  Mood Depressed  Thought Process  Coherency WDL  Content WDL  Delusions None reported or observed  Perception WDL  Hallucination None reported or observed  Judgment WDL  Confusion WDL  Danger to Self  Current suicidal ideation? Denies  Danger to Others  Danger to Others None reported or observed

## 2023-10-13 NOTE — Group Note (Signed)
 Date:  10/13/2023 Time:  9:00 PM  Group Topic/Focus:  Spirituality:   The focus of this group is to discuss how one's spirituality can aide in recovery.    Participation Level:  Active  Participation Quality:  Appropriate, Attentive, and Sharing  Affect:  Appropriate  Cognitive:  Alert and Appropriate  Insight: Appropriate  Engagement in Group:  Engaged and Improving  Modes of Intervention:  Discussion, Education, Problem-solving, Rapport Building, Dance movement psychotherapist, and Support  Additional Comments:     Jacob Maynard 10/13/2023, 9:00 PM

## 2023-10-13 NOTE — Progress Notes (Signed)
 [facility] MD Progress Note  10/13/2023 5:05 PM Jacob Maynard  MRN:  [id number]   Subjective:  Chart reviewed, case discussed in multidisciplinary meeting, patient seen during rounds.   On interview today patient is noted to be laying in bed.  He states he slept poorly last night for about 5.5 hours due to a toothache.  He reports his appetite is good. He denies symptoms of depression at this time.He endorses anxiety which is helped by as needed hydroxyzine .  He denies SI/HI/plan and denies hallucinations.  When asked if there is anybody we can contact to share his care plan with, patient declined.  Patient states he prefers to avoid daily medication in general but is okay with as needed medications at this time.  He does not voice any concerns or complaints today.   Sleep: Poor  Appetite:  Good  Past Psychiatric History: see h&P Family History: History reviewed. No pertinent family history. Social History:  Social History   Substance and Sexual Activity  Alcohol Use No     Social History   Substance and Sexual Activity  Drug Use Yes   Types: Marijuana   Comment: daily, recenlty hx of methamphetamine and cocaine    Social History   Socioeconomic History   Marital status: Significant Other    Spouse name: Not on file   Number of children: Not on file   Years of education: Not on file   Highest education level: Not on file  Occupational History   Not on file  Tobacco Use   Smoking status: Every Day    Current packs/day: 0.50    Types: Cigarettes   Smokeless tobacco: Never  Vaping Use   Vaping status: Not on file  Substance and Sexual Activity   Alcohol use: No   Drug use: Yes    Types: Marijuana    Comment: daily, recenlty hx of methamphetamine and cocaine   Sexual activity: Not on file  Other Topics Concern   Not on file  Social History Narrative   Not on file   Social Drivers of Health   Financial Resource Strain: Not on file  Food Insecurity: No Food  Insecurity (10/11/2023)   Hunger Vital Sign    Worried About Running Out of Food in the Last Year: Never true    Ran Out of Food in the Last Year: Never true  Transportation Needs: No Transportation Needs (10/11/2023)   PRAPARE - Administrator, Civil Service (Medical): No    Lack of Transportation (Non-Medical): No  Physical Activity: Not on file  Stress: Not on file  Social Connections: Not on file   Past Medical History:  Past Medical History:  Diagnosis Date   ADHD (attention deficit hyperactivity disorder)     Past Surgical History:  Procedure Laterality Date   NO PAST SURGERIES      Current Medications: Current Facility-Administered Medications  Medication Dose Route Frequency Provider Last Rate Last Admin   acetaminophen  (TYLENOL ) tablet 650 mg  650 mg Oral Q6H PRN Hampton, Tracie B, NP   650 mg at 10/13/23 0830   alum & mag hydroxide-simeth (MAALOX/MYLANTA) 200-200-20 MG/5ML suspension 30 mL  30 mL Oral Q4H PRN Hampton, Tracie B, NP       haloperidol  (HALDOL ) tablet 5 mg  5 mg Oral TID PRN Hampton, Tracie B, NP       And   diphenhydrAMINE  (BENADRYL ) capsule 50 mg  50 mg Oral TID PRN Hampton, Tracie B, NP  haloperidol  lactate (HALDOL ) injection 5 mg  5 mg Intramuscular TID PRN Hampton, Tracie B, NP       And   diphenhydrAMINE  (BENADRYL ) injection 50 mg  50 mg Intramuscular TID PRN Hampton, Tracie B, NP       And   LORazepam  (ATIVAN ) injection 2 mg  2 mg Intramuscular TID PRN Hampton, Tracie B, NP       hydrOXYzine  (ATARAX ) tablet 25 mg  25 mg Oral TID PRN Hampton, Tracie B, NP   25 mg at 10/13/23 0830   magnesium  hydroxide (MILK OF MAGNESIA) suspension 30 mL  30 mL Oral Daily PRN Hampton, Tracie B, NP       nicotine  (NICODERM CQ  - dosed in mg/24 hours) patch 14 mg  14 mg Transdermal Daily Jadapalle, Sree, MD   14 mg at 10/13/23 9171   traZODone  (DESYREL ) tablet 50 mg  50 mg Oral QHS PRN Hampton, Tracie B, NP   50 mg at 10/12/23 2136    Lab Results: No  results found for this or any previous visit (from the past 48 hours).  Blood Alcohol level:  Lab Results  Component Value Date   [facility] <15 10/08/2023    Metabolic Disorder Labs: No results found for: HGBA1C, MPG No results found for: PROLACTIN No results found for: CHOL, TRIG, HDL, CHOLHDL, VLDL, LDLCALC  Physical Findings: AIMS:  , ,  ,  ,    CIWA:    COWS:      Psychiatric Specialty Exam:  Presentation  General Appearance:  Casual  Eye Contact: Good  Speech: Clear and Coherent  Speech Volume: Normal    Mood and Affect  Mood: Euthymic  Affect: Congruent   Thought Process  Thought Processes: Coherent  Descriptions of Associations:Intact  Orientation:Full (Time, Place and Person)  Thought Content:Logical  Hallucinations:Hallucinations: None  Ideas of Reference:None  Suicidal Thoughts:Suicidal Thoughts: No  Homicidal Thoughts:Homicidal Thoughts: No   Sensorium  Memory: Immediate Good; Recent Good; Remote Good  Judgment: Poor  Insight: Poor   Executive Functions  Concentration: Fair  Attention Span: Fair  Recall: Fair  Fund of Knowledge: Fair  Language: Fair   Psychomotor Activity  Psychomotor Activity: Psychomotor Activity: Normal  Musculoskeletal: Strength & Muscle Tone: within normal limits Gait & Station: normal Assets  Assets: Communication Skills    Physical Exam: Physical Exam ROS Blood pressure 111/67, pulse 73, temperature (!) 97.5 F (36.4 C), temperature source Oral, resp. rate 17, height 5' 8 (1.727 m), weight 79.8 kg, SpO2 100%. Body mass index is 26.76 kg/m.  Diagnosis: Principal Problem:   Depression, unspecified   PLAN: Safety and Monitoring:  -- Voluntary admission to inpatient psychiatric unit for safety, stabilization and treatment  -- Daily contact with patient to assess and evaluate symptoms and progress in treatment  -- Patient's case to be discussed in  multi-disciplinary team meeting  -- Observation Level : q15 minute checks  -- Vital signs:  q12 hours  -- Precautions: suicide, elopement, and assault -- Encouraged patient to participate in unit milieu and in scheduled group therapies  2. Psychiatric Diagnoses and Treatment:  MDD, recurrent, severe Anxiety disorder Patient declines psychotropic medication.  They do not meet criteria for nonemergent enforced medication at this time. Continue hydroxyzine  25 mg 3 times daily as needed for anxiety.  The risks/benefits/side-effects/alternatives to this medication were discussed in detail with the patient and time was given for questions. The patient consents to medication trial. -- Metabolic profile and EKG monitoring obtained while on an  atypical antipsychotic  -- Encouraged patient to participate in unit milieu and in scheduled group therapies       3. Medical Issues Being Addressed:  Continue NicoDerm patch for nicotine  dependence   4. Discharge Planning:   -- Social work and case management to assist with discharge planning and identification of hospital follow-up needs prior to discharge  -- Estimated LOS: 3-4 days  Camelia LITTIE Lukes, PA-C 10/13/2023, 5:05 PM

## 2023-10-13 NOTE — Group Note (Signed)
 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 Patients and LRT discuss what it means to be "at peace", what it feels like physically and mentally. Pts are given a canvas and watercolor paint to use and encouraged to draw their idea of a peaceful place. Pts and LRT discuss how they use this in their daily life post discharge. Pts are encouraged to take their canvas home with them as a reminder to find their peaceful place whenever they are feeling depressed, anxious, etc.    Goal Area(s) Addressed:  Patient will identify what it means to experience a "peaceful" emotion. Patient will identify a new coping skill.  Patient will express their emotions through art. Patients will increase communication by talking with LRT and peers while in group.   Affect/Mood: Appropriate   Participation Level: Moderate    Clinical Observations/Individualized Feedback: Josh came late to group. Pt was present for less than half of the session. Pt shared that his peaceful place is where the sand meets the ocean.   Plan: Continue to engage patient in RT group sessions 2-3x/week.   Jeoffrey BRAVO Celestia, LRT, CTRS 10/13/2023 1:03 PM
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
## 2023-10-14 NOTE — Group Note (Signed)
 Date:  10/14/2023 Time:  3:09 PM  Group Topic/Focus:  Building Self Esteem:   The Focus of this group is helping patients become aware of the effects of self-esteem on their lives, the things they and others do that enhance or undermine their self-esteem, seeing the relationship between their level of self-esteem and the choices they make and learning ways to enhance self-esteem.    Participation Level:  Did Not Attend   Camellia HERO Kostantinos Tallman 10/14/2023, 3:09 PM

## 2023-10-14 NOTE — Progress Notes (Signed)
   10/14/23 0839  Psych Admission Type (Psych Patients Only)  Admission Status Involuntary  Psychosocial Assessment  Patient Complaints None  Eye Contact Fair  Facial Expression Animated  Affect Anxious  Speech Logical/coherent  Interaction Assertive  Motor Activity Slow  Appearance/Hygiene In scrubs  Behavior Characteristics Cooperative  Mood Depressed  Thought Process  Coherency WDL  Content WDL  Delusions None reported or observed  Perception WDL  Hallucination None reported or observed  Judgment WDL  Confusion None  Danger to Self  Current suicidal ideation? Denies  Danger to Others  Danger to Others None reported or observed

## 2023-10-14 NOTE — Group Note (Signed)
 Date:  10/14/2023 Time:  8:43 PM  Group Topic/Focus:  Wrap-Up Group:   The focus of this group is to help patients review their daily goal of treatment and discuss progress on daily workbooks.    Participation Level:  Active  Participation Quality:  Appropriate and Attentive  Affect:  Appropriate and Flat  Cognitive:  Appropriate  Insight: Appropriate and Limited  Engagement in Group:  Limited  Modes of Intervention:  Discussion  Additional Comments:     Kerri Katz 10/14/2023, 8:43 PM

## 2023-10-14 NOTE — Group Note (Signed)
 [facility] LCSW Group Therapy Note   Group Date: 10/14/2023 Start Time: 1300 End Time: 1415   Type of Therapy/Topic:  Group Therapy:  Emotion Regulation  Participation Level:  Minimal    Description of Group:    The purpose of this group is to assist patients in learning to regulate negative emotions and experience positive emotions. Patients will be guided to discuss ways in which they have been vulnerable to their negative emotions. These vulnerabilities will be juxtaposed with experiences of positive emotions or situations, and patients challenged to use positive emotions to combat negative ones. Special emphasis will be placed on coping with negative emotions in conflict situations, and patients will process healthy conflict resolution skills.  Therapeutic Goals: Patient will identify two positive emotions or experiences to reflect on in order to balance out negative emotions:  Patient will label two or more emotions that they find the most difficult to experience:  Patient will be able to demonstrate positive conflict resolution skills through discussion or role plays:   Summary of Patient Progress: Patient was present for the entirety of the group process. He was minimally involved in the discussion. Pt appeared open and receptive to feedback from his peers and the facilitator.    Therapeutic Modalities:   Cognitive Behavioral Therapy Feelings Identification Dialectical Behavioral Therapy   Nadara JONELLE Fam, LCSW

## 2023-10-14 NOTE — Group Note (Signed)
 Date:  10/14/2023 Time:  7:11 PM  Group Topic/Focus:  Wellness Toolbox:   The focus of this group is to discuss various aspects of wellness, balancing those aspects and exploring ways to increase the ability to experience wellness.  Patients will create a wellness toolbox for use upon discharge.    Participation Level:  Active  Participation Quality:  Appropriate  Affect:  Appropriate  Cognitive:  Appropriate  Insight: Appropriate  Engagement in Group:  Engaged  Modes of Intervention:  Activity and Socialization  Additional Comments:    Deitra Caron Mainland 10/14/2023, 7:11 PM

## 2023-10-14 NOTE — Progress Notes (Signed)
   10/14/23 0600  Psych Admission Type (Psych Patients Only)  Admission Status Involuntary  Psychosocial Assessment  Patient Complaints None  Eye Contact Fair  Facial Expression Animated  Affect Anxious  Speech Logical/coherent  Interaction Cautious  Motor Activity Slow  Appearance/Hygiene Disheveled  Behavior Characteristics Cooperative  Mood Depressed  Aggressive Behavior  Effect No apparent injury  Thought Process  Coherency WDL  Content WDL  Delusions None reported or observed  Perception WDL  Hallucination None reported or observed  Judgment WDL  Confusion WDL  Danger to Self  Current suicidal ideation? Denies  Danger to Others  Danger to Others None reported or observed

## 2023-10-14 NOTE — Plan of Care (Signed)
   Problem: Education: Goal: Knowledge of [facility] Education information/materials will improve Outcome: Progressing Goal: Emotional status will improve Outcome: Progressing Goal: Mental status will improve Outcome: Progressing Goal: Verbalization of understanding the information provided will improve Outcome: Progressing

## 2023-10-15 NOTE — Progress Notes (Signed)
   10/15/23 0000  Psych Admission Type (Psych Patients Only)  Admission Status Involuntary  Psychosocial Assessment  Patient Complaints None  Eye Contact Fair  Facial Expression Animated  Affect Anxious  Speech Logical/coherent  Interaction Assertive  Motor Activity Slow  Appearance/Hygiene In scrubs  Behavior Characteristics Cooperative  Mood Depressed  Aggressive Behavior  Effect No apparent injury  Thought Process  Coherency WDL  Content WDL  Delusions None reported or observed  Perception WDL  Hallucination None reported or observed  Judgment WDL  Confusion WDL  Danger to Self  Current suicidal ideation? Denies  Danger to Others  Danger to Others None reported or observed

## 2023-10-15 NOTE — Group Note (Deleted)
 [facility] LCSW Group Therapy Note   Group Date: 10/15/2023 Start Time: 1305 End Time: 1422   Type of Therapy and Topic: Group Therapy: Avoiding Self-Sabotaging and Enabling Behaviors  Participation Level: {[facility] PARTICIPATION OZCZO:[id number]}  Mood:  Description of Group:  In this group, patients will learn how to identify obstacles, self-sabotaging and enabling behaviors, as well as: what are they, why do we do them and what needs these behaviors meet. Discuss unhealthy relationships and how to have positive healthy boundaries with those that sabotage and enable. Explore aspects of self-sabotage and enabling in yourself and how to limit these self-destructive behaviors in everyday life.   Therapeutic Goals: 1. Patient will identify one obstacle that relates to self-sabotage and enabling behaviors 2. Patient will identify one personal self-sabotaging or enabling behavior they did prior to admission 3. Patient will state a plan to change the above identified behavior 4. Patient will demonstrate ability to communicate their needs through discussion and/or role play.    Summary of Patient Progress:   ***   Therapeutic Modalities:  Cognitive Behavioral Therapy Person-Centered Therapy Motivational Interviewing    Sejla Marzano M Ala Kratz, LCSW

## 2023-10-15 NOTE — Progress Notes (Addendum)
 [facility] MD Progress Note  10/15/2023 6:33 PM Jacob Maynard  MRN:  [id number]   Subjective:  Chart reviewed, case discussed in multidisciplinary meeting, patient seen during rounds.   9/18: On interview today patient is noted to be laying in bed reading a book.  He continues to deny symptoms of depression and endorses occasional anxiety, for which he takes hydroxyzine  as needed with benefit.  He rates depression as 0 out of 10 and anxiety at 0 out of 10 at this time.  He denies SI/HI/plan and denies hallucinations.  He continues to maintain safe behaviors on the unit.  He continues to decline psychotropic medication.  Patient gives consent for provider to contact his fiance, Jacob Maynard. Collateral obtained from [facility].  She states she does not believe patient's overdose was a suicide attempt.  She does believe he shows some signs of depression and is currently undergoing significant financial stressors, including unemployment and the possibility of losing their home.  She states patient had an episode of SI a long time ago but has not expressed SI recently.  She denies any known past suicide attempts by patient.  She confirms patient does not have access to guns or other lethal weapons.  She states upon discharge, patient will be living with her and her mother.    9/17: On interview today patient is noted to be laying in bed reading a book.  Patient reports sleep continues to be poor due to a toothache.  He reports appetite continues to be good.  He denies symptoms of depression but does experience some anxiety for which he takes as needed hydroxyzine  with good effect.  He continues to state drug overdose was accidental.  He denies SI/HI/plan and denies hallucinations.  He continues to decline addition of psychotropic medication, feeling he does not need it at this time.  9/16: On interview today patient is noted to be laying in bed.  He states he slept poorly last night for about 5.5  hours due to a toothache.  He reports his appetite is good. He denies symptoms of depression at this time.He endorses anxiety which is helped by as needed hydroxyzine .  He denies SI/HI/plan and denies hallucinations.  When asked if there is anybody we can contact to share his care plan with, patient declined.  Patient states he prefers to avoid daily medication in general but is okay with as needed medications at this time.  He does not voice any concerns or complaints today.   Sleep: Poor  Appetite:  Good  Past Psychiatric History: see h&P Family History: History reviewed. No pertinent family history. Social History:  Social History   Substance and Sexual Activity  Alcohol Use No     Social History   Substance and Sexual Activity  Drug Use Yes   Types: Marijuana   Comment: daily, recenlty hx of methamphetamine and cocaine    Social History   Socioeconomic History   Marital status: Significant Other    Spouse name: Not on file   Number of children: Not on file   Years of education: Not on file   Highest education level: Not on file  Occupational History   Not on file  Tobacco Use   Smoking status: Every Day    Current packs/day: 0.50    Types: Cigarettes   Smokeless tobacco: Never  Vaping Use   Vaping status: Not on file  Substance and Sexual Activity   Alcohol use: No   Drug use: Yes  Types: Marijuana    Comment: daily, recenlty hx of methamphetamine and cocaine   Sexual activity: Not on file  Other Topics Concern   Not on file  Social History Narrative   Not on file   Social Drivers of Health   Financial Resource Strain: Not on file  Food Insecurity: No Food Insecurity (10/11/2023)   Hunger Vital Sign    Worried About Running Out of Food in the Last Year: Never true    Ran Out of Food in the Last Year: Never true  Transportation Needs: No Transportation Needs (10/11/2023)   PRAPARE - Administrator, Civil Service (Medical): No    Lack of  Transportation (Non-Medical): No  Physical Activity: Not on file  Stress: Not on file  Social Connections: Not on file   Past Medical History:  Past Medical History:  Diagnosis Date   ADHD (attention deficit hyperactivity disorder)     Past Surgical History:  Procedure Laterality Date   NO PAST SURGERIES      Current Medications: Current Facility-Administered Medications  Medication Dose Route Frequency Provider Last Rate Last Admin   acetaminophen  (TYLENOL ) tablet 650 mg  650 mg Oral Q6H PRN Hampton, Tracie B, NP   650 mg at 10/14/23 2131   alum & mag hydroxide-simeth (MAALOX/MYLANTA) 200-200-20 MG/5ML suspension 30 mL  30 mL Oral Q4H PRN Hampton, Tracie B, NP       haloperidol  (HALDOL ) tablet 5 mg  5 mg Oral TID PRN Hampton, Tracie B, NP       And   diphenhydrAMINE  (BENADRYL ) capsule 50 mg  50 mg Oral TID PRN Hampton, Tracie B, NP       haloperidol  lactate (HALDOL ) injection 5 mg  5 mg Intramuscular TID PRN Hampton, Tracie B, NP       And   diphenhydrAMINE  (BENADRYL ) injection 50 mg  50 mg Intramuscular TID PRN Hampton, Tracie B, NP       And   LORazepam  (ATIVAN ) injection 2 mg  2 mg Intramuscular TID PRN Hampton, Tracie B, NP       hydrOXYzine  (ATARAX ) tablet 25 mg  25 mg Oral TID PRN Hampton, Tracie B, NP   25 mg at 10/15/23 0843   magnesium  hydroxide (MILK OF MAGNESIA) suspension 30 mL  30 mL Oral Daily PRN Hampton, Tracie B, NP       nicotine  (NICODERM CQ  - dosed in mg/24 hours) patch 14 mg  14 mg Transdermal Daily Jadapalle, Sree, MD   14 mg at 10/15/23 0843   traZODone  (DESYREL ) tablet 50 mg  50 mg Oral QHS PRN Hampton, Tracie B, NP   50 mg at 10/14/23 2130    Lab Results: No results found for this or any previous visit (from the past 48 hours).  Blood Alcohol level:  Lab Results  Component Value Date   [facility] <15 10/08/2023    Metabolic Disorder Labs: No results found for: HGBA1C, MPG No results found for: PROLACTIN No results found for: CHOL, TRIG, HDL,  CHOLHDL, VLDL, LDLCALC  Physical Findings: AIMS:  , ,  ,  ,    CIWA:    COWS:      Psychiatric Specialty Exam:  Presentation  General Appearance:  Casual  Eye Contact: Good  Speech: Clear and Coherent  Speech Volume: Normal    Mood and Affect  Mood: Euthymic  Affect: Congruent   Thought Process  Thought Processes: Coherent  Descriptions of Associations:Intact  Orientation:Full (Time, Place and Person)  Thought  Content:Logical  Hallucinations:No data recorded  Ideas of Reference:None  Suicidal Thoughts:No data recorded  Homicidal Thoughts:No data recorded   Sensorium  Memory: Immediate Good; Recent Good; Remote Good  Judgment: Poor  Insight: Poor   Executive Functions  Concentration: Fair  Attention Span: Fair  Recall: Fair  Fund of Knowledge: Fair  Language: Fair   Psychomotor Activity  Psychomotor Activity: No data recorded  Musculoskeletal: Strength & Muscle Tone: within normal limits Gait & Station: normal Assets  Assets: Communication Skills    Physical Exam: Physical Exam ROS Blood pressure 112/69, pulse 93, temperature 98 F (36.7 C), resp. rate 18, height 5' 8 (1.727 m), weight 79.8 kg, SpO2 97%. Body mass index is 26.76 kg/m.  Diagnosis: Principal Problem:   Depression, unspecified   PLAN: Safety and Monitoring:  -- Voluntary admission to inpatient psychiatric unit for safety, stabilization and treatment  -- Daily contact with patient to assess and evaluate symptoms and progress in treatment  -- Patient's case to be discussed in multi-disciplinary team meeting  -- Observation Level : q15 minute checks  -- Vital signs:  q12 hours  -- Precautions: suicide, elopement, and assault -- Encouraged patient to participate in unit milieu and in scheduled group therapies  2. Psychiatric Diagnoses and Treatment:  MDD, recurrent, severe Anxiety disorder Patient declines psychotropic medication.  They  do not meet criteria for nonemergent enforced medication at this time. Continue hydroxyzine  25 mg 3 times daily as needed for anxiety.   The risks/benefits/side-effects/alternatives to this medication were discussed in detail with the patient and time was given for questions. The patient consents to medication trial. -- Metabolic profile and EKG monitoring obtained while on an atypical antipsychotic  -- Encouraged patient to participate in unit milieu and in scheduled group therapies       3. Medical Issues Being Addressed:  Continue NicoDerm patch for nicotine  dependence   4. Discharge Planning:   -- Social work and case management to assist with discharge planning and identification of hospital follow-up needs prior to discharge  -- Estimated LOS: 3-4 days  Camelia LITTIE Lukes, PA-C 10/15/2023, 6:33 PM

## 2023-10-15 NOTE — Group Note (Signed)
 Recreation Therapy Group Note   Group Topic:Animal Assisted Therapy   Group Date: 10/15/2023 Start Time: 1000 End Time: 1035 Facilitators: Celestia Jeoffrey BRAVO, LRT, CTRS Location: Courtyard  Group Description: AAA. Animal-Assisted Activity provides opportunities for motivational, educational, therapeutic and/or recreational benefits to enhance quality of life. Selinda Geophysicist/field seismologist) and Rollo (dog) visited the unit to interact with patients.   Goal Areas Addressed:  Reduced anxiety and stress Improved mood Increased social interaction Enhanced communication skills Reduced loneliness and isolation Improved emotional regulation   Affect/Mood: N/A   Participation Level: Did not attend    Clinical Observations/Individualized Feedback: Patient did not attend group.   Plan: Continue to engage patient in RT group sessions 2-3x/week.   Jeoffrey BRAVO Celestia, LRT, CTRS 10/15/2023 1:17 PM

## 2023-10-15 NOTE — Progress Notes (Signed)
 [facility] MD Progress Note  10/14/2023 3:33 PM Jacob Maynard  MRN:  [id number]   Subjective:  Chart reviewed, case discussed in multidisciplinary meeting, patient seen during rounds.   9/17: On interview today patient is noted to be laying in bed reading a book.  Patient reports sleep continues to be poor due to a toothache.  He reports appetite continues to be good.  He denies symptoms of depression but does experience some anxiety for which he takes as needed hydroxyzine  with good effect.  He continues to state drug overdose was accidental.  He denies SI/HI/plan and denies hallucinations.  He continues to decline addition of psychotropic medication, feeling he does not need it at this time.  9/16: On interview today patient is noted to be laying in bed.  He states he slept poorly last night for about 5.5 hours due to a toothache.  He reports his appetite is good. He denies symptoms of depression at this time.He endorses anxiety which is helped by as needed hydroxyzine .  He denies SI/HI/plan and denies hallucinations.  When asked if there is anybody we can contact to share his care plan with, patient declined.  Patient states he prefers to avoid daily medication in general but is okay with as needed medications at this time.  He does not voice any concerns or complaints today.   Sleep: Poor  Appetite:  Good  Past Psychiatric History: see h&P Family History: History reviewed. No pertinent family history. Social History:  Social History   Substance and Sexual Activity  Alcohol Use No     Social History   Substance and Sexual Activity  Drug Use Yes   Types: Marijuana   Comment: daily, recenlty hx of methamphetamine and cocaine    Social History   Socioeconomic History   Marital status: Significant Other    Spouse name: Not on file   Number of children: Not on file   Years of education: Not on file   Highest education level: Not on file  Occupational History   Not on file  Tobacco  Use   Smoking status: Every Day    Current packs/day: 0.50    Types: Cigarettes   Smokeless tobacco: Never  Vaping Use   Vaping status: Not on file  Substance and Sexual Activity   Alcohol use: No   Drug use: Yes    Types: Marijuana    Comment: daily, recenlty hx of methamphetamine and cocaine   Sexual activity: Not on file  Other Topics Concern   Not on file  Social History Narrative   Not on file   Social Drivers of Health   Financial Resource Strain: Not on file  Food Insecurity: No Food Insecurity (10/11/2023)   Hunger Vital Sign    Worried About Running Out of Food in the Last Year: Never true    Ran Out of Food in the Last Year: Never true  Transportation Needs: No Transportation Needs (10/11/2023)   PRAPARE - Administrator, Civil Service (Medical): No    Lack of Transportation (Non-Medical): No  Physical Activity: Not on file  Stress: Not on file  Social Connections: Not on file   Past Medical History:  Past Medical History:  Diagnosis Date   ADHD (attention deficit hyperactivity disorder)     Past Surgical History:  Procedure Laterality Date   NO PAST SURGERIES      Current Medications: Current Facility-Administered Medications  Medication Dose Route Frequency Provider Last Rate Last Admin  acetaminophen  (TYLENOL ) tablet 650 mg  650 mg Oral Q6H PRN Hampton, Tracie B, NP   650 mg at 10/14/23 2131   alum & mag hydroxide-simeth (MAALOX/MYLANTA) 200-200-20 MG/5ML suspension 30 mL  30 mL Oral Q4H PRN Hampton, Tracie B, NP       haloperidol  (HALDOL ) tablet 5 mg  5 mg Oral TID PRN Hampton, Tracie B, NP       And   diphenhydrAMINE  (BENADRYL ) capsule 50 mg  50 mg Oral TID PRN Hampton, Tracie B, NP       haloperidol  lactate (HALDOL ) injection 5 mg  5 mg Intramuscular TID PRN Hampton, Tracie B, NP       And   diphenhydrAMINE  (BENADRYL ) injection 50 mg  50 mg Intramuscular TID PRN Hampton, Tracie B, NP       And   LORazepam  (ATIVAN ) injection 2 mg  2 mg  Intramuscular TID PRN Hampton, Tracie B, NP       hydrOXYzine  (ATARAX ) tablet 25 mg  25 mg Oral TID PRN Hampton, Tracie B, NP   25 mg at 10/15/23 0843   magnesium  hydroxide (MILK OF MAGNESIA) suspension 30 mL  30 mL Oral Daily PRN Hampton, Tracie B, NP       nicotine  (NICODERM CQ  - dosed in mg/24 hours) patch 14 mg  14 mg Transdermal Daily Jadapalle, Sree, MD   14 mg at 10/15/23 0843   traZODone  (DESYREL ) tablet 50 mg  50 mg Oral QHS PRN Hampton, Tracie B, NP   50 mg at 10/14/23 2130    Lab Results: No results found for this or any previous visit (from the past 48 hours).  Blood Alcohol level:  Lab Results  Component Value Date   [facility] <15 10/08/2023    Metabolic Disorder Labs: No results found for: HGBA1C, MPG No results found for: PROLACTIN No results found for: CHOL, TRIG, HDL, CHOLHDL, VLDL, LDLCALC  Physical Findings: AIMS:  , ,  ,  ,    CIWA:    COWS:      Psychiatric Specialty Exam:  Presentation  General Appearance:  Casual  Eye Contact: Good  Speech: Clear and Coherent  Speech Volume: Normal    Mood and Affect  Mood: Euthymic  Affect: Congruent   Thought Process  Thought Processes: Coherent  Descriptions of Associations:Intact  Orientation:Full (Time, Place and Person)  Thought Content:Logical  Hallucinations:No data recorded  Ideas of Reference:None  Suicidal Thoughts:No data recorded  Homicidal Thoughts:No data recorded   Sensorium  Memory: Immediate Good; Recent Good; Remote Good  Judgment: Poor  Insight: Poor   Executive Functions  Concentration: Fair  Attention Span: Fair  Recall: [employer] of Knowledge: Fair  Language: Fair   Psychomotor Activity  Psychomotor Activity: No data recorded  Musculoskeletal: Strength & Muscle Tone: within normal limits Gait & Station: normal Assets  Assets: Communication Skills    Physical Exam: Physical Exam ROS Blood pressure 110/69, pulse  66, temperature (!) 97.2 F (36.2 C), resp. rate 18, height 5' 8 (1.727 m), weight 79.8 kg, SpO2 99%. Body mass index is 26.76 kg/m.  Diagnosis: Principal Problem:   Depression, unspecified   PLAN: Safety and Monitoring:  -- Voluntary admission to inpatient psychiatric unit for safety, stabilization and treatment  -- Daily contact with patient to assess and evaluate symptoms and progress in treatment  -- Patient's case to be discussed in multi-disciplinary team meeting  -- Observation Level : q15 minute checks  -- Vital signs:  q12 hours  --  Precautions: suicide, elopement, and assault -- Encouraged patient to participate in unit milieu and in scheduled group therapies  2. Psychiatric Diagnoses and Treatment:  MDD, recurrent, severe Anxiety disorder Patient declines psychotropic medication.  They do not meet criteria for nonemergent enforced medication at this time. Continue hydroxyzine  25 mg 3 times daily as needed for anxiety.   The risks/benefits/side-effects/alternatives to this medication were discussed in detail with the patient and time was given for questions. The patient consents to medication trial. -- Metabolic profile and EKG monitoring obtained while on an atypical antipsychotic  -- Encouraged patient to participate in unit milieu and in scheduled group therapies       3. Medical Issues Being Addressed:  Continue NicoDerm patch for nicotine  dependence   4. Discharge Planning:   -- Social work and case management to assist with discharge planning and identification of hospital follow-up needs prior to discharge  -- Estimated LOS: 3-4 days  Camelia LITTIE Lukes, PA-C 10/15/2023, 3:33 PM

## 2023-10-15 NOTE — Plan of Care (Signed)
   Problem: Education: Goal: Knowledge of [facility] Education information/materials will improve Outcome: Progressing Goal: Emotional status will improve Outcome: Progressing Goal: Mental status will improve Outcome: Progressing Goal: Verbalization of understanding the information provided will improve Outcome: Progressing

## 2023-10-15 NOTE — Progress Notes (Signed)
   10/15/23 1300  Psych Admission Type (Psych Patients Only)  Admission Status Involuntary  Psychosocial Assessment  Patient Complaints Anxiety (PRN hydroxyzine  manages it well)  Eye Contact Fair  Facial Expression Animated  Affect Anxious  Speech Logical/coherent  Interaction Assertive  Motor Activity Slow  Appearance/Hygiene In scrubs  Behavior Characteristics Cooperative  Mood Depressed;Pleasant  Aggressive Behavior  Effect No apparent injury  Thought Process  Coherency WDL  Content WDL  Delusions None reported or observed  Perception WDL  Hallucination None reported or observed  Judgment WDL  Confusion None  Danger to Self  Current suicidal ideation? Denies  Self-Injurious Behavior No self-injurious ideation or behavior indicators observed or expressed   Agreement Not to Harm Self Yes  Description of Agreement Verbal  Danger to Others  Danger to Others None reported or observed

## 2023-10-15 NOTE — Group Note (Signed)
 Date:  10/15/2023 Time:  9:54 PM  Group Topic/Focus:  Overcoming Stress:   The focus of this group is to define stress and help patients assess their triggers.    Participation Level:  Active  Participation Quality:  Appropriate and Attentive  Affect:  Appropriate  Cognitive:  Appropriate  Insight: Appropriate and Good  Engagement in Group:  Engaged  Modes of Intervention:  Discussion  Additional Comments:     Kerri Katz 10/15/2023, 9:54 PM

## 2023-10-15 NOTE — Plan of Care (Signed)
  Problem: Education: Goal: Emotional status will improve Outcome: Progressing   Problem: Education: Goal: Mental status will improve Outcome: Progressing   Problem: Activity: Goal: Sleeping patterns will improve Outcome: Progressing   Problem: Coping: Goal: Ability to verbalize frustrations and anger appropriately will improve Outcome: Progressing   Problem: Coping: Goal: Ability to demonstrate self-control will improve Outcome: Progressing   Problem: Safety: Goal: Periods of time without injury will increase Outcome: Progressing

## 2023-10-15 NOTE — Group Note (Addendum)
 [facility] LCSW Group Therapy Note   Group Date: 10/15/2023 Start Time: 1305 End Time: 1415   Type of Therapy/Topic:  Group Therapy:  Emotion Regulation  Participation Level:  Did Not Attend   Mood: Did not attend.   Description of Group:    The purpose of this group is to assist patients in learning to regulate negative emotions and experience positive emotions. Patients will be guided to discuss ways in which they have been vulnerable to their negative emotions. These vulnerabilities will be juxtaposed with experiences of positive emotions or situations, and patients challenged to use positive emotions to combat negative ones. Special emphasis will be placed on coping with negative emotions in conflict situations, and patients will process healthy conflict resolution skills.  Therapeutic Goals: Patient will identify two positive emotions or experiences to reflect on in order to balance out negative emotions:  Patient will label two or more emotions that they find the most difficult to experience:  Patient will be able to demonstrate positive conflict resolution skills through discussion or role plays:   Summary of Patient Progress:   Patient did not attend.     Therapeutic Modalities:   Cognitive Behavioral Therapy Feelings Identification Dialectical Behavioral Therapy   Alveta CHRISTELLA Kerns, LCSW

## 2023-10-15 NOTE — Group Note (Signed)
 Date:  10/15/2023 Time:  4:06 PM  Group Topic/Focus:  Goals Group:   The focus of this group is to help patients establish daily goals to achieve during treatment and discuss how the patient can incorporate goal setting into their daily lives to aide in recovery.    Participation Level:  Active  Participation Quality:  Appropriate  Affect:  Appropriate  Cognitive:  Alert  Insight: Appropriate  Engagement in Group:  Engaged  Modes of Intervention:  Activity, Discussion, and Education  Additional Comments:    Dearion Huot L Daryle Boyington 10/15/2023, 4:06 PM

## 2023-10-16 MED ORDER — NICOTINE 14 MG/24HR TD PT24: 14.0000 mg | MEDICATED_PATCH | Freq: Every day | TRANSDERMAL | 0 refills | Status: DC

## 2023-10-16 MED ORDER — HYDROXYZINE HCL 25 MG PO TABS: 25.0000 mg | ORAL_TABLET | Freq: Three times a day (TID) | ORAL | 0 refills | Status: DC | PRN

## 2023-10-16 NOTE — Progress Notes (Signed)
   10/16/23 0942  Psych Admission Type (Psych Patients Only)  Admission Status Involuntary  Psychosocial Assessment  Patient Complaints None  Eye Contact Fair  Facial Expression Animated  Affect Anxious  Speech Logical/coherent  Interaction Assertive  Motor Activity Slow  Appearance/Hygiene Unremarkable  Behavior Characteristics Cooperative  Mood Depressed  Aggressive Behavior  Effect No apparent injury  Thought Process  Coherency WDL  Content WDL  Delusions None reported or observed  Perception WDL  Hallucination None reported or observed  Judgment WDL  Confusion WDL  Danger to Self  Current suicidal ideation? Denies  Self-Injurious Behavior No self-injurious ideation or behavior indicators observed or expressed   Agreement Not to Harm Self Yes  Description of Agreement verbal  Danger to Others  Danger to Others None reported or observed

## 2023-10-16 NOTE — Group Note (Signed)
 Recreation Therapy Group Note   Group Topic:Leisure Education  Group Date: 10/16/2023 Start Time: 1300 End Time: 1400 Facilitators: Celestia Jeoffrey BRAVO, LRT, CTRS Location: Craft Room  Group Description: Leisure. Patients were given the option to choose from journaling, coloring, drawing, making origami, playing with playdoh, listening to music or singing karaoke. LRT and pts discussed the meaning of leisure, the importance of participating in leisure during their free time/when they're outside of the hospital, as well as how our leisure interests can also serve as coping skills.   Goal Area(s) Addressed:  Patient will identify a current leisure interest.  Patient will learn the definition of "leisure". Patient will practice making a positive decision. Patient will have the opportunity to try a new leisure activity. Patient will communicate with peers and LRT.    Affect/Mood: Appropriate   Participation Level: Active and Engaged   Participation Quality: Independent   Behavior: Appropriate, Calm, and Cooperative   Speech/Thought Process: Coherent   Insight: Good   Judgement: Good   Modes of Intervention: Clarification, Education, Exploration, and Music   Patient Response to Interventions:  Attentive, Engaged, Interested , and Receptive   Education Outcome:  Acknowledges education   Clinical Observations/Individualized Feedback: Jacob Maynard was active in their participation of session activities and group discussion. Pt identified play instruments and sew as things he does in his free time. Pt chose to make origami while in group.    Plan: Continue to engage patient in RT group sessions 2-3x/week.   Jeoffrey BRAVO Celestia, LRT, CTRS 10/16/2023 2:12 PM

## 2023-10-16 NOTE — Progress Notes (Signed)
   10/16/23 0315  Psych Admission Type (Psych Patients Only)  Admission Status Involuntary  Psychosocial Assessment  Patient Complaints Anxiety  Eye Contact Fair  Facial Expression Animated  Affect Anxious  Speech Logical/coherent  Interaction Assertive  Motor Activity Slow  Appearance/Hygiene In scrubs  Behavior Characteristics Cooperative  Mood Depressed  Aggressive Behavior  Effect No apparent injury  Thought Process  Coherency WDL  Content WDL  Delusions None reported or observed  Perception WDL  Hallucination None reported or observed  Judgment WDL  Confusion WDL  Danger to Self  Current suicidal ideation? Denies  Danger to Others  Danger to Others None reported or observed

## 2023-10-16 NOTE — Progress Notes (Addendum)
 [facility] MD Progress Note  10/16/2023 5:40 PM Jacob Maynard  MRN:  [id number]   Subjective:  Chart reviewed, case discussed in multidisciplinary meeting, patient seen during rounds.   9/19: On interview today patient is noted to be laying in bed but sits up to engage in interview.  He is calm and cooperative, alert and oriented.  He rates depression as 0 out of 10 and anxiety as 0 out of 10 today.  He continues to take hydroxyzine  as needed for anxiety with good effect.  He denies SI/HI/plan and denies hallucinations.  He does not voice any concerns or complaints today.  He is future oriented.  Patient has maintained safe behaviors.   9/18: On interview today patient is noted to be laying in bed reading a book.  He continues to deny symptoms of depression and endorses occasional anxiety, for which he takes hydroxyzine  as needed with benefit.  He rates depression as 0 out of 10 and anxiety at 0 out of 10 at this time.  He denies SI/HI/plan and denies hallucinations.  He continues to maintain safe behaviors on the unit.  He continues to decline psychotropic medication.  Patient gives consent for provider to contact his fiance, Jacob Maynard. Collateral obtained from [facility].  She states she does not believe patient's overdose was a suicide attempt.  She does believe he shows some signs of depression and is currently undergoing significant financial stressors, including unemployment and the possibility of losing their home.  She states patient had an episode of SI a long time ago but has not expressed SI recently.  She denies any known past suicide attempts by patient.  She confirms patient does not have access to guns or other lethal weapons.  She states upon discharge, patient will be living with her and her mother.    9/17: On interview today patient is noted to be laying in bed reading a book.  Patient reports sleep continues to be poor due to a toothache.  He reports appetite continues to  be good.  He denies symptoms of depression but does experience some anxiety for which he takes as needed hydroxyzine  with good effect.  He continues to state drug overdose was accidental.  He denies SI/HI/plan and denies hallucinations.  He continues to decline addition of psychotropic medication, feeling he does not need it at this time.  9/16: On interview today patient is noted to be laying in bed.  He states he slept poorly last night for about 5.5 hours due to a toothache.  He reports his appetite is good. He denies symptoms of depression at this time.He endorses anxiety which is helped by as needed hydroxyzine .  He denies SI/HI/plan and denies hallucinations.  When asked if there is anybody we can contact to share his care plan with, patient declined.  Patient states he prefers to avoid daily medication in general but is okay with as needed medications at this time.  He does not voice any concerns or complaints today.   Sleep: Fair  Appetite:  Good  Past Psychiatric History: see h&P Family History: History reviewed. No pertinent family history. Social History:  Social History   Substance and Sexual Activity  Alcohol Use No     Social History   Substance and Sexual Activity  Drug Use Yes   Types: Marijuana   Comment: daily, recenlty hx of methamphetamine and cocaine    Social History   Socioeconomic History   Marital status: Significant Other  Spouse name: Not on file   Number of children: Not on file   Years of education: Not on file   Highest education level: Not on file  Occupational History   Not on file  Tobacco Use   Smoking status: Every Day    Current packs/day: 0.50    Types: Cigarettes   Smokeless tobacco: Never  Vaping Use   Vaping status: Not on file  Substance and Sexual Activity   Alcohol use: No   Drug use: Yes    Types: Marijuana    Comment: daily, recenlty hx of methamphetamine and cocaine   Sexual activity: Not on file  Other Topics Concern    Not on file  Social History Narrative   Not on file   Social Drivers of Health   Financial Resource Strain: Not on file  Food Insecurity: No Food Insecurity (10/11/2023)   Hunger Vital Sign    Worried About Running Out of Food in the Last Year: Never true    Ran Out of Food in the Last Year: Never true  Transportation Needs: No Transportation Needs (10/11/2023)   PRAPARE - Administrator, Civil Service (Medical): No    Lack of Transportation (Non-Medical): No  Physical Activity: Not on file  Stress: Not on file  Social Connections: Not on file   Past Medical History:  Past Medical History:  Diagnosis Date   ADHD (attention deficit hyperactivity disorder)     Past Surgical History:  Procedure Laterality Date   NO PAST SURGERIES      Current Medications: Current Facility-Administered Medications  Medication Dose Route Frequency Provider Last Rate Last Admin   acetaminophen  (TYLENOL ) tablet 650 mg  650 mg Oral Q6H PRN Hampton, Tracie B, NP   650 mg at 10/16/23 0832   alum & mag hydroxide-simeth (MAALOX/MYLANTA) 200-200-20 MG/5ML suspension 30 mL  30 mL Oral Q4H PRN Hampton, Tracie B, NP       haloperidol  (HALDOL ) tablet 5 mg  5 mg Oral TID PRN Hampton, Tracie B, NP       And   diphenhydrAMINE  (BENADRYL ) capsule 50 mg  50 mg Oral TID PRN Hampton, Tracie B, NP       haloperidol  lactate (HALDOL ) injection 5 mg  5 mg Intramuscular TID PRN Hampton, Tracie B, NP       And   diphenhydrAMINE  (BENADRYL ) injection 50 mg  50 mg Intramuscular TID PRN Hampton, Tracie B, NP       And   LORazepam  (ATIVAN ) injection 2 mg  2 mg Intramuscular TID PRN Hampton, Tracie B, NP       hydrOXYzine  (ATARAX ) tablet 25 mg  25 mg Oral TID PRN Hampton, Tracie B, NP   25 mg at 10/16/23 9166   magnesium  hydroxide (MILK OF MAGNESIA) suspension 30 mL  30 mL Oral Daily PRN Hampton, Tracie B, NP       nicotine  (NICODERM CQ  - dosed in mg/24 hours) patch 14 mg  14 mg Transdermal Daily Jadapalle, Sree, MD    14 mg at 10/16/23 0835   traZODone  (DESYREL ) tablet 50 mg  50 mg Oral QHS PRN Hampton, Tracie B, NP   50 mg at 10/15/23 2117    Lab Results: No results found for this or any previous visit (from the past 48 hours).  Blood Alcohol level:  Lab Results  Component Value Date   [facility] <15 10/08/2023    Metabolic Disorder Labs: No results found for: HGBA1C, MPG No results found for:  PROLACTIN No results found for: CHOL, TRIG, HDL, CHOLHDL, VLDL, LDLCALC  Physical Findings: AIMS:  , ,  ,  ,    CIWA:    COWS:      Psychiatric Specialty Exam:  Presentation  General Appearance:  Casual  Eye Contact: Good  Speech: Clear and Coherent  Speech Volume: Normal    Mood and Affect  Mood: Euthymic  Affect: Congruent   Thought Process  Thought Processes: Coherent  Descriptions of Associations:Intact  Orientation:Full (Time, Place and Person)  Thought Content:Logical  Hallucinations: None  Ideas of Reference:None  Suicidal Thoughts: No  Homicidal Thoughts: No   Sensorium  Memory: Immediate Good; Recent Good; Remote Good  Judgment: Poor  Insight: Poor   Executive Functions  Concentration: Fair  Attention Span: Fair  Recall: Fair  Fund of Knowledge: Fair  Language: Fair   Psychomotor Activity  Psychomotor Activity: No data recorded  Musculoskeletal: Strength & Muscle Tone: within normal limits Gait & Station: normal Assets  Assets: Communication Skills    Physical Exam: Physical Exam ROS Blood pressure 114/60, pulse 86, temperature 98.1 F (36.7 C), temperature source Oral, resp. rate 20, height 5' 8 (1.727 m), weight 79.8 kg, SpO2 99%. Body mass index is 26.76 kg/m.  Diagnosis: Principal Problem:   Depression, unspecified   PLAN: Safety and Monitoring:  -- Voluntary admission to inpatient psychiatric unit for safety, stabilization and treatment  -- Daily contact with patient to assess and evaluate  symptoms and progress in treatment  -- Patient's case to be discussed in multi-disciplinary team meeting  -- Observation Level : q15 minute checks  -- Vital signs:  q12 hours  -- Precautions: suicide, elopement, and assault -- Encouraged patient to participate in unit milieu and in scheduled group therapies  2. Psychiatric Diagnoses and Treatment:  MDD, recurrent, severe Anxiety disorder Patient declines psychotropic medication.  They do not meet criteria for nonemergent enforced medication at this time. Continue hydroxyzine  25 mg 3 times daily as needed for anxiety.   The risks/benefits/side-effects/alternatives to this medication were discussed in detail with the patient and time was given for questions. The patient consents to medication trial. -- Metabolic profile and EKG monitoring obtained while on an atypical antipsychotic  -- Encouraged patient to participate in unit milieu and in scheduled group therapies       3. Medical Issues Being Addressed:  Continue NicoDerm patch for nicotine  dependence   4. Discharge Planning:             -- Saturday  -- Social work and case management to assist with discharge planning and identification of hospital follow-up needs prior to discharge  -- Estimated LOS: 3-4 days  Camelia LITTIE Lukes, PA-C 10/16/2023, 5:40 PM

## 2023-10-16 NOTE — Plan of Care (Signed)
   Problem: Education: Goal: Knowledge of [facility] Education information/materials will improve Outcome: Progressing Goal: Emotional status will improve Outcome: Progressing Goal: Mental status will improve Outcome: Progressing Goal: Verbalization of understanding the information provided will improve Outcome: Progressing

## 2023-10-16 NOTE — Plan of Care (Signed)

## 2023-10-16 NOTE — [facility] Suicide Risk Assessment (Signed)
 [facility] Discharge Suicide Risk Assessment   Principal Problem: Depression, unspecified Discharge Diagnoses: Principal Problem:   Depression, unspecified   Total Time spent with patient: 30 minutes  Musculoskeletal: Strength & Muscle Tone: within normal limits Gait & Station: normal Patient leans: N/A  Psychiatric Specialty Exam  Presentation  General Appearance:  Casual  Eye Contact: Good  Speech: Clear and Coherent  Speech Volume: Normal  Handedness:No data recorded  Mood and Affect  Mood: Euthymic  Duration of Depression Symptoms: No data recorded Affect: Appropriate   Thought Process  Thought Processes: Coherent; Linear  Descriptions of Associations:Intact  Orientation:Full (Time, Place and Person)  Thought Content:Logical  History of Schizophrenia/Schizoaffective disorder:No data recorded Duration of Psychotic Symptoms:No data recorded Hallucinations:Hallucinations: None  Ideas of Reference:None  Suicidal Thoughts:Suicidal Thoughts: No  Homicidal Thoughts:Homicidal Thoughts: No   Sensorium  Memory: Immediate Good; Recent Good; Remote Good  Judgment: Fair  Insight: Fair   Chartered certified accountant: Fair  Attention Span: Fair  Recall: [employer] of Knowledge: Fair  Language: Fair   Psychomotor Activity  Psychomotor Activity: Psychomotor Activity: Normal   Assets  Assets: Communication Skills; Social Support   Sleep  Sleep: Sleep: Fair  Estimated Sleeping Duration (Last 24 Hours): 5.50-7.25 hours  Physical Exam: Physical Exam ROS Blood pressure 114/60, pulse 86, temperature 98.1 F (36.7 C), temperature source Oral, resp. rate 20, height 5' 8 (1.727 m), weight 79.8 kg, SpO2 99%. Body mass index is 26.76 kg/m.  Mental Status Per Nursing Assessment::   On Admission:  NA  Demographic Factors:  Male  Loss Factors: Decrease in vocational status  Historical Factors: Family history of suicide  and Family history of mental illness or substance abuse  Risk Reduction Factors:   Living with another person, especially a relative and Positive social support  Continued Clinical Symptoms:  Depression:   Impulsivity  Cognitive Features That Contribute To Risk:  None    Suicide Risk:  Minimal: No identifiable suicidal ideation.  Patients presenting with no risk factors but with morbid ruminations; may be classified as minimal risk based on the severity of the depressive symptoms   Follow-up Information     [facility] Follow up.   Why: Virtual assessment for therapy and medication management is 10/22/23 at 8:30 AM. Contact information: [address]  [address] [place] [address] [phone]                 Plan Of Care/Follow-up recommendations:  Activity:  as tolerated  Camelia LITTIE Lukes, PA-C 10/16/2023, 5:46 PM

## 2023-10-16 NOTE — [facility] Suicide Risk Assessment (Signed)
 [facility] INPATIENT:  Family/Significant Other Suicide Prevention Education  Suicide Prevention Education:  Education Completed; Suzen Pao, ex, [phone], has been identified by the patient as the family member/significant other with whom the patient will be residing, and identified as the person(s) who will aid the patient in the event of a mental health crisis (suicidal ideations/suicide attempt).  With written consent from the patient, the family member/significant other has been provided the following suicide prevention education, prior to the and/or following the discharge of the patient.  The suicide prevention education provided includes the following: Suicide risk factors Suicide prevention and interventions National Suicide Hotline telephone number [facility] assessment telephone number [facility] Emergency Assistance 911 [facility] and/or Residential Mobile Crisis Unit telephone number  Request made of family/significant other to: Remove weapons (e.g., guns, rifles, knives), all items previously/currently identified as safety concern.   Remove drugs/medications (over-the-counter, prescriptions, illicit drugs), all items previously/currently identified as a safety concern.  The family member/significant other verbalizes understanding of the suicide prevention education information provided.  The family member/significant other agrees to remove the items of safety concern listed above.  Wife confirmed there are no weapons in the home. Wife reported no present safety concerns. Wife to provide transportation at discharge.   Alveta CHRISTELLA Kerns 10/16/2023, 1:59 PM

## 2023-10-17 ENCOUNTER — Other Ambulatory Visit: Payer: Self-pay

## 2023-10-17 LAB — PROTIME-INR
INR: 1 (ref 0.8–1.2)
Prothrombin Time: 13.5 s (ref 11.4–15.2)

## 2023-10-17 MED ORDER — NICOTINE 14 MG/24HR TD PT24
14.0000 mg | MEDICATED_PATCH | Freq: Every day | TRANSDERMAL | 0 refills | Status: AC
  Filled 2023-10-17: qty 28, 28d supply, fill #0

## 2023-10-17 MED ORDER — HYDROXYZINE HCL 25 MG PO TABS
25.0000 mg | ORAL_TABLET | Freq: Three times a day (TID) | ORAL | 0 refills | Status: AC | PRN
  Filled 2023-10-17: qty 90, 30d supply, fill #0

## 2023-10-17 NOTE — Group Note (Signed)
 Date:  10/17/2023 Time:  11:58 AM  Group Topic/Focus:  Emotional Education:   The focus of this group is to discuss what feelings/emotions are, and how they are experienced.    Participation Level:  Active  Participation Quality:  Appropriate  Affect:  Appropriate  Cognitive:  Alert  Insight: Appropriate  Engagement in Group:  Engaged  Modes of Intervention:  Activity  Additional Comments:    Camellia HERO Lavette Yankovich 10/17/2023, 11:58 AM

## 2023-10-17 NOTE — Progress Notes (Signed)
 Patient denies SI/I/AVH at this time. Discharge instructions, AVS, prescriptions, and transition record reviewed with patient. Patient agrees to comply with medication management, follow-up visit and outpatient therapy. Patient belongings returned to patient. Patient questions and concerns addressed and answered. Patient ambulatory off unit. Patient discharged to home. Transportation was provided by his spouse.

## 2023-10-17 NOTE — Progress Notes (Signed)
  [facility] Adult Case Management Discharge Plan :  Will you be returning to the same living situation after discharge:  Yes,  pt's residence on file At discharge, do you have transportation home?: Yes,  pt's fiance Do you have the ability to pay for your medications: No. Pharmacy will provide meds  Release of information consent forms completed and in the chart;  Patient's signature needed at discharge.  Patient to Follow up at:  Follow-up Information     [facility] Follow up.   Why: Virtual assessment for therapy and medication management is 10/22/23 at 8:30 AM. Contact information: [address]  [address] [place] [address] [phone]                 Next level of care provider has access to [facility] Link:no  Safety Planning and Suicide Prevention discussed: Yes,  Completed on 09/19      Has patient been referred to the Quitline?: Patient refused referral for treatment  Patient has been referred for addiction treatment: Yes, the patient will follow up with an outpatient provider for substance use disorder. Psychiatrist/APP: appointment made and Therapist: appointment made  Rolin JONETTA Kerns, LCSW 10/17/2023, 11:19 AM

## 2023-10-17 NOTE — Discharge Summary (Signed)
 Physician Discharge Summary Note  Patient:  Jacob Maynard is an 38 y.o., male MRN:  [id number] DOB:  02-08-1985 Patient phone:  [phone] (home)  Patient address:   [address] [place] [address],   Total time spent: 40 min Date of Admission:  10/11/2023 Date of Discharge: 10/17/2023  Reason for Admission:  This is a 38 y/o male patient with limited psychiatric treatment history, significant psychosocial stressors including recent relationship loss, unemployment, and lack of supports, presenting under IVC after reported accidental ingestion of warfarin initially believed to be Benadryl . Despite his denial of suicidality, collateral reveals recent suicidal behaviors, threats, physical aggression, and a prior attempt this year. His presentation, combined with psychosocial decline, poor supports, and conflicting reports regarding intentionality, indicates risk for self-injury, and lack of safe discharge plan. Patient is currently under involuntary commitment and requires inpatient psychiatric hospitalization for medication management and stabilization due to risk of self injury.    Principal Problem: Depression, unspecified Discharge Diagnoses: Principal Problem:   Depression, unspecified   Past Psychiatric History: Remote history of ADD per patient   Family Psychiatric  History: Schizophrenia in uncle, bipolar disorder in uncle Family Hx suicide: Paternal uncle, possibly others Social History:  Social History   Substance and Sexual Activity  Alcohol Use No     Social History   Substance and Sexual Activity  Drug Use Yes   Types: Marijuana   Comment: daily, recenlty hx of methamphetamine and cocaine    Social History   Socioeconomic History   Marital status: Significant Other    Spouse name: Not on file   Number of children: Not on file   Years of education: Not on file   Highest education level: Not on file  Occupational History   Not on file   Tobacco Use   Smoking status: Every Day    Current packs/day: 0.50    Types: Cigarettes   Smokeless tobacco: Never  Vaping Use   Vaping status: Not on file  Substance and Sexual Activity   Alcohol use: No   Drug use: Yes    Types: Marijuana    Comment: daily, recenlty hx of methamphetamine and cocaine   Sexual activity: Not on file  Other Topics Concern   Not on file  Social History Narrative   Not on file   Social Drivers of Health   Financial Resource Strain: Not on file  Food Insecurity: No Food Insecurity (10/11/2023)   Hunger Vital Sign    Worried About Running Out of Food in the Last Year: Never true    Ran Out of Food in the Last Year: Never true  Transportation Needs: No Transportation Needs (10/11/2023)   PRAPARE - Administrator, Civil Service (Medical): No    Lack of Transportation (Non-Medical): No  Physical Activity: Not on file  Stress: Not on file  Social Connections: Not on file   Past Medical History:  Past Medical History:  Diagnosis Date   ADHD (attention deficit hyperactivity disorder)     Past Surgical History:  Procedure Laterality Date   NO PAST SURGERIES     Family History: History reviewed. No pertinent family history.  Hospital Course:  The following treatment plan was followed for patient while inpatient:   Safety and Monitoring:             -- Voluntary admission to inpatient psychiatric unit for safety, stabilization and treatment             --  Daily contact with patient to assess and evaluate symptoms and progress in treatment             -- Patient's case to be discussed in multi-disciplinary team meeting             -- Observation Level : q15 minute checks             -- Vital signs:  q12 hours             -- Precautions: suicide, elopement, and assault -- Encouraged patient to participate in unit milieu and in scheduled group therapies  2. Psychiatric Diagnoses and Treatment:  MDD, recurrent, severe Anxiety  disorder Patient declines psychotropic medication.  They do not meet criteria for nonemergent enforced medication at this time. Continue hydroxyzine  25 mg 3 times daily as needed for anxiety.             The risks/benefits/side-effects/alternatives to this medication were discussed in detail with the patient and time was given for questions. The patient consents to medication trial. -- Metabolic profile and EKG monitoring obtained while on an atypical antipsychotic  -- Encouraged patient to participate in unit milieu and in scheduled group therapies   3. Medical Issues Being Addressed:  Continue NicoDerm patch for nicotine  dependence   On the day of discharge, 10/17/2023,following sustained improvement in the affect of this patient, continued report of euthymic mood, repeated denial of suicidal, homicidal and other violent ideations, adequate interaction with peers, active participation in groups while on the unit, and denial of adverse reactions from the medications, the treatment team decided that patient could be discharged back home. A comprehensive risk assessment was done prior to discharge and shows that patient is at low risk for suicide or violence and will continue to be if patient complies with the treatment recommendations, medications and therapy. Patient agrees to call Crisis Services, 911 and/or return to the ED if safety cannot be maintained outside the hospital setting. Discharge medications reviewed with patient, patient provided with prescriptions prior to discharge, explanation of indication, risks/benefits and side effects profiles. The patient verbalized understanding and is in agreement with the discharge plan.  Detailed risk assessment is complete based on clinical exam and individual risk factors and acute suicide risk is low and acute violence risk is low.     Currently, all modifiable risk of harm to self/harm to others have been addressed and patient is no longer appropriate  for the acute inpatient setting and is able to continue treatment for mental health needs in the community with the supports as indicated below.  Patient is educated and verbalized understanding of discharge plan of care including medications, follow-up appointments, mental health resources and further crisis services in the community.  He is instructed to call 911 or present to the nearest emergency room should he experience any decompensation in mood, disturbance of bowel or return of suicidal/homicidal ideations.  Patient verbalizes understanding of this education and agrees to this plan of care  Physical Findings: AIMS:  , ,  ,  ,    CIWA:    COWS:        Psychiatric Specialty Exam:  Presentation  General Appearance:  Appropriate for Environment  Eye Contact: Good  Speech: Clear and Coherent  Speech Volume: Normal    Mood and Affect  Mood: Euthymic  Affect: Appropriate   Thought Process  Thought Processes: Coherent  Descriptions of Associations:Intact  Orientation:Full (Time, Place and Person)  Thought Content:WDL  Hallucinations:Hallucinations: None  Ideas of Reference:None  Suicidal Thoughts:Suicidal Thoughts: No  Homicidal Thoughts:Homicidal Thoughts: No   Sensorium  Memory: Immediate Fair; Recent Fair; Remote Fair  Judgment: Fair  Insight: Fair   Art therapist  Concentration: Fair  Attention Span: Good  Recall: Good  Fund of Knowledge: Good  Language: Good   Psychomotor Activity  Psychomotor Activity: Psychomotor Activity: Normal  Musculoskeletal: Strength & Muscle Tone: within normal limits Gait & Station: normal Assets  Assets: Social Support; Manufacturing systems engineer; Desire for Improvement   Sleep  Sleep: Sleep: Fair    Physical Exam: Physical Exam Pulmonary:     Effort: Pulmonary effort is normal.  Musculoskeletal:     Cervical back: Normal range of motion.  Neurological:     Mental Status: He is  alert and oriented to person, place, and time.    Review of Systems  Psychiatric/Behavioral:  Negative for depression, hallucinations and suicidal ideas.    Blood pressure 105/75, pulse 82, temperature (!) 96.8 F (36 C), resp. rate 19, height 5' 8 (1.727 m), weight 79.8 kg, SpO2 100%. Body mass index is 26.76 kg/m.   Social History   Tobacco Use  Smoking Status Every Day   Current packs/day: 0.50   Types: Cigarettes  Smokeless Tobacco Never   Tobacco Cessation:  A prescription for an FDA-approved tobacco cessation medication provided at discharge   Blood Alcohol level:  Lab Results  Component Value Date   [facility] <15 10/08/2023    Metabolic Disorder Labs:  No results found for: HGBA1C, MPG No results found for: PROLACTIN No results found for: CHOL, TRIG, HDL, CHOLHDL, VLDL, LDLCALC  See Psychiatric Specialty Exam and Suicide Risk Assessment completed by Attending Physician prior to discharge.  Discharge destination:  Home  Is patient on multiple antipsychotic therapies at discharge:  No   Has Patient had three or more failed trials of antipsychotic monotherapy by history:  No  Recommended Plan for Multiple Antipsychotic Therapies: NA  Discharge Instructions     Diet - low sodium heart healthy   Complete by: As directed    Increase activity slowly   Complete by: As directed    Increase activity slowly   Complete by: As directed       Allergies as of 10/17/2023       Reactions   Amoxicillin    Doesn't remember   Penicillins    Doesn't remember   Tramadol Nausea And Vomiting        Medication List     TAKE these medications      Indication  hydrOXYzine  25 MG tablet Commonly known as: ATARAX  Take 1 tablet (25 mg total) by mouth 3 (three) times daily as needed for anxiety.  Indication: Feeling Anxious   nicotine  14 mg/24hr patch Commonly known as: NICODERM CQ  - dosed in mg/24 hours Place 1 patch (14 mg total) onto the skin  daily.  Indication: Nicotine  Addiction        Follow-up Information     [facility] Follow up.   Why: Virtual assessment for therapy and medication management is 10/22/23 at 8:30 AM. Contact information: [address]  [address] [place] [address] [phone]                 Follow-up recommendations:  Activity:  Increase as tolerated Diet:  Return to normal as tolerated    Signed: Zelda Sharps, NP

## 2023-10-17 NOTE — Progress Notes (Signed)
   10/17/23 0957  Psych Admission Type (Psych Patients Only)  Admission Status Involuntary  Psychosocial Assessment  Patient Complaints Anxiety  Eye Contact Fair  Facial Expression Animated  Affect Anxious  Speech Logical/coherent  Interaction Assertive  Motor Activity Slow  Appearance/Hygiene Unremarkable  Behavior Characteristics Cooperative  Mood Pleasant  Aggressive Behavior  Effect No apparent injury  Thought Process  Coherency WDL  Content WDL  Delusions None reported or observed  Perception WDL  Hallucination None reported or observed  Judgment WDL  Confusion WDL  Danger to Self  Current suicidal ideation? Denies  Self-Injurious Behavior No self-injurious ideation or behavior indicators observed or expressed   Agreement Not to Harm Self Yes  Description of Agreement verbal  Danger to Others  Danger to Others None reported or observed
